# Patient Record
Sex: Male | Born: 1935 | Race: White | Hispanic: No | Marital: Married
Health system: Southern US, Community
[De-identification: ages and names within clinical notes are randomized; demographics above are authoritative.]

---

## 2004-06-22 ENCOUNTER — Ambulatory Visit: Payer: Self-pay | Admitting: Urology

## 2004-07-02 ENCOUNTER — Ambulatory Visit: Payer: Self-pay | Admitting: Radiation Oncology

## 2004-07-10 ENCOUNTER — Ambulatory Visit: Payer: Self-pay | Admitting: Radiation Oncology

## 2004-08-09 ENCOUNTER — Ambulatory Visit: Payer: Self-pay | Admitting: Radiation Oncology

## 2004-09-09 ENCOUNTER — Ambulatory Visit: Payer: Self-pay | Admitting: Radiation Oncology

## 2004-10-10 ENCOUNTER — Ambulatory Visit: Payer: Self-pay | Admitting: Radiation Oncology

## 2005-01-30 ENCOUNTER — Ambulatory Visit: Payer: Self-pay | Admitting: Radiation Oncology

## 2005-02-07 ENCOUNTER — Ambulatory Visit: Payer: Self-pay | Admitting: Radiation Oncology

## 2005-07-08 ENCOUNTER — Ambulatory Visit: Payer: Self-pay | Admitting: Radiation Oncology

## 2005-08-19 ENCOUNTER — Ambulatory Visit: Payer: Self-pay | Admitting: Internal Medicine

## 2005-10-21 ENCOUNTER — Ambulatory Visit: Payer: Self-pay | Admitting: Unknown Physician Specialty

## 2005-12-26 ENCOUNTER — Ambulatory Visit: Payer: Self-pay | Admitting: Unknown Physician Specialty

## 2006-07-03 ENCOUNTER — Ambulatory Visit: Payer: Self-pay | Admitting: Radiation Oncology

## 2006-07-10 ENCOUNTER — Ambulatory Visit: Payer: Self-pay | Admitting: Radiation Oncology

## 2007-07-11 ENCOUNTER — Ambulatory Visit: Payer: Self-pay | Admitting: Radiation Oncology

## 2007-07-20 ENCOUNTER — Ambulatory Visit: Payer: Self-pay | Admitting: Radiation Oncology

## 2007-08-10 ENCOUNTER — Ambulatory Visit: Payer: Self-pay | Admitting: Radiation Oncology

## 2008-07-10 ENCOUNTER — Ambulatory Visit: Payer: Self-pay | Admitting: Radiation Oncology

## 2008-07-21 ENCOUNTER — Ambulatory Visit: Payer: Self-pay | Admitting: Radiation Oncology

## 2008-08-09 ENCOUNTER — Ambulatory Visit: Payer: Self-pay | Admitting: Radiation Oncology

## 2009-07-10 ENCOUNTER — Ambulatory Visit: Payer: Self-pay | Admitting: Radiation Oncology

## 2009-07-20 ENCOUNTER — Ambulatory Visit: Payer: Self-pay | Admitting: Radiation Oncology

## 2009-08-09 ENCOUNTER — Ambulatory Visit: Payer: Self-pay | Admitting: Radiation Oncology

## 2010-07-20 ENCOUNTER — Ambulatory Visit: Payer: Self-pay | Admitting: Radiation Oncology

## 2010-07-21 LAB — PSA

## 2010-08-09 ENCOUNTER — Ambulatory Visit: Payer: Self-pay | Admitting: Radiation Oncology

## 2011-08-26 ENCOUNTER — Ambulatory Visit: Payer: Self-pay | Admitting: Radiation Oncology

## 2011-08-27 LAB — PSA: PSA: 0.7 ng/mL (ref 0.0–4.0)

## 2011-09-10 ENCOUNTER — Ambulatory Visit: Payer: Self-pay | Admitting: Radiation Oncology

## 2012-05-05 ENCOUNTER — Emergency Department: Payer: Self-pay

## 2012-07-22 ENCOUNTER — Ambulatory Visit: Payer: Self-pay | Admitting: Urology

## 2012-07-27 ENCOUNTER — Ambulatory Visit: Payer: Self-pay | Admitting: Urology

## 2013-04-20 ENCOUNTER — Ambulatory Visit: Payer: Self-pay | Admitting: Ophthalmology

## 2013-04-25 ENCOUNTER — Observation Stay: Payer: Self-pay | Admitting: Internal Medicine

## 2013-04-25 LAB — COMPREHENSIVE METABOLIC PANEL
Anion Gap: 6 — ABNORMAL LOW (ref 7–16)
Bilirubin,Total: 1 mg/dL (ref 0.2–1.0)
Chloride: 109 mmol/L — ABNORMAL HIGH (ref 98–107)
Co2: 27 mmol/L (ref 21–32)
EGFR (African American): >60
EGFR (Non-African Amer.): >60
Osmolality: 291 (ref 275–301)
SGPT (ALT): 20 U/L (ref 12–78)
Total Protein: 7.1 g/dL (ref 6.4–8.2)

## 2013-04-25 LAB — CBC
HGB: 13.4 g/dL (ref 13.0–18.0)
MCH: 34.6 pg — ABNORMAL HIGH (ref 26.0–34.0)
MCHC: 34 g/dL (ref 32.0–36.0)
Platelet: 157 10*3/uL (ref 150–440)
RBC: 3.88 10*6/uL — ABNORMAL LOW (ref 4.40–5.90)
WBC: 14.6 10*3/uL — ABNORMAL HIGH (ref 3.8–10.6)

## 2013-04-25 LAB — TROPONIN I: Troponin-I: <0.02 ng/mL

## 2013-04-25 LAB — URINALYSIS, COMPLETE
Bacteria: NONE SEEN
Nitrite: NEGATIVE
Ph: 6 (ref 4.5–8.0)
RBC,UR: 14 /HPF (ref 0–5)

## 2013-04-26 LAB — CBC WITH DIFFERENTIAL/PLATELET
Basophil #: 0 10*3/uL (ref 0.0–0.1)
Basophil %: 0.2 %
Eosinophil #: 0.1 10*3/uL (ref 0.0–0.7)
HCT: 35.5 % — ABNORMAL LOW (ref 40.0–52.0)
Lymphocyte #: 1.6 10*3/uL (ref 1.0–3.6)
Lymphocyte %: 16.4 %
MCH: 35.3 pg — ABNORMAL HIGH (ref 26.0–34.0)
MCHC: 34.6 g/dL (ref 32.0–36.0)
Monocyte #: 1.1 x10 3/mm — ABNORMAL HIGH (ref 0.2–1.0)
Neutrophil %: 70.7 %
Platelet: 136 10*3/uL — ABNORMAL LOW (ref 150–440)
WBC: 9.6 10*3/uL (ref 3.8–10.6)

## 2013-04-26 LAB — BASIC METABOLIC PANEL
Calcium, Total: 8.5 mg/dL (ref 8.5–10.1)
EGFR (African American): >60
EGFR (Non-African Amer.): >60
Osmolality: 289 (ref 275–301)
Sodium: 143 mmol/L (ref 136–145)

## 2013-04-27 LAB — BASIC METABOLIC PANEL
Anion Gap: 2 — ABNORMAL LOW (ref 7–16)
Chloride: 109 mmol/L — ABNORMAL HIGH (ref 98–107)
Co2: 29 mmol/L (ref 21–32)
Creatinine: 0.69 mg/dL (ref 0.60–1.30)
EGFR (African American): >60
EGFR (Non-African Amer.): >60
Glucose: 80 mg/dL (ref 65–99)
Sodium: 140 mmol/L (ref 136–145)

## 2013-08-21 ENCOUNTER — Emergency Department: Payer: Self-pay | Admitting: Emergency Medicine

## 2013-08-21 LAB — COMPREHENSIVE METABOLIC PANEL
Albumin: 3.7 g/dL (ref 3.4–5.0)
Anion Gap: 3 — ABNORMAL LOW (ref 7–16)
BUN: 16 mg/dL (ref 7–18)
Bilirubin,Total: 0.7 mg/dL (ref 0.2–1.0)
Calcium, Total: 9 mg/dL (ref 8.5–10.1)
Chloride: 104 mmol/L (ref 98–107)
Co2: 32 mmol/L (ref 21–32)
EGFR (African American): >60
EGFR (Non-African Amer.): >60
Glucose: 91 mg/dL (ref 65–99)
Osmolality: 278 (ref 275–301)
Potassium: 4.2 mmol/L (ref 3.5–5.1)
SGOT(AST): 27 U/L (ref 15–37)
SGPT (ALT): 22 U/L (ref 12–78)
Sodium: 139 mmol/L (ref 136–145)
Total Protein: 7 g/dL (ref 6.4–8.2)

## 2013-08-21 LAB — CBC WITH DIFFERENTIAL/PLATELET
Basophil #: 0 10*3/uL (ref 0.0–0.1)
Eosinophil #: 0.1 10*3/uL (ref 0.0–0.7)
HGB: 13.8 g/dL (ref 13.0–18.0)
Lymphocyte %: 16.4 %
MCH: 34.5 pg — ABNORMAL HIGH (ref 26.0–34.0)
RBC: 4.02 10*6/uL — ABNORMAL LOW (ref 4.40–5.90)
RDW: 14.4 % (ref 11.5–14.5)
WBC: 9 10*3/uL (ref 3.8–10.6)

## 2013-08-21 LAB — PRO B NATRIURETIC PEPTIDE: B-Type Natriuretic Peptide: 92 pg/mL (ref 0–450)

## 2013-11-27 IMAGING — CT CT HEAD WITHOUT CONTRAST
3 of 4 series · 16 of 30 positions shown, 18 images · non-contrast
Comparison: none

REASON FOR EXAM: weakness
COMMENTS:

[Series 2: without · axial · non-contrast · 0.48mm/px · z∈[-189,-89]mm · 5 of 32 slices shown]
[im 6/32  brain]
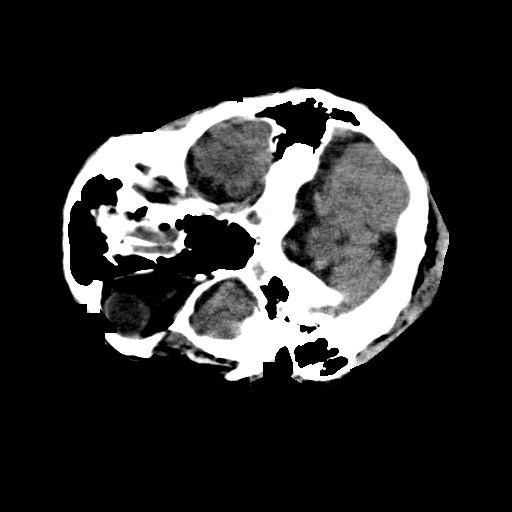
[im 11/32  brain]
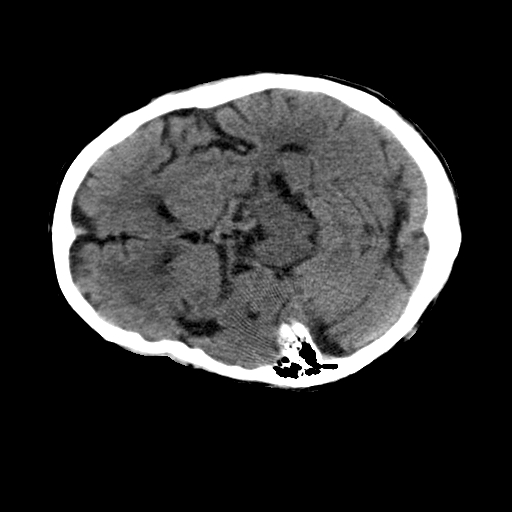
[im 16/32  brain]
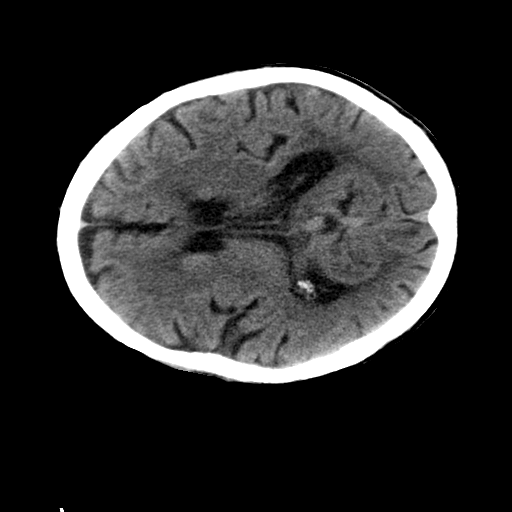
[im 21/32  brain]
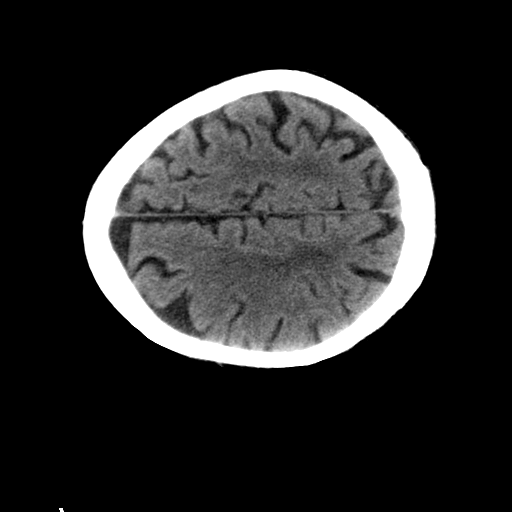
[im 26/32  brain]
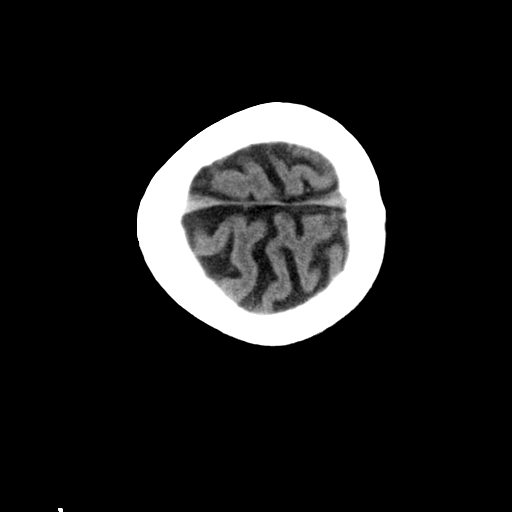

[Series 4: without recon · axial · non-contrast · 0.45mm/px · z∈[-166,-68]mm · 5 of 30 slices shown, 7 images]
[im 5/30  brain]
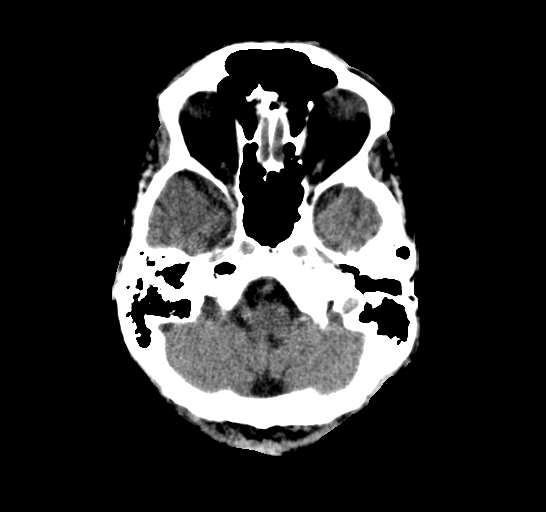
[im 5/30  bone]
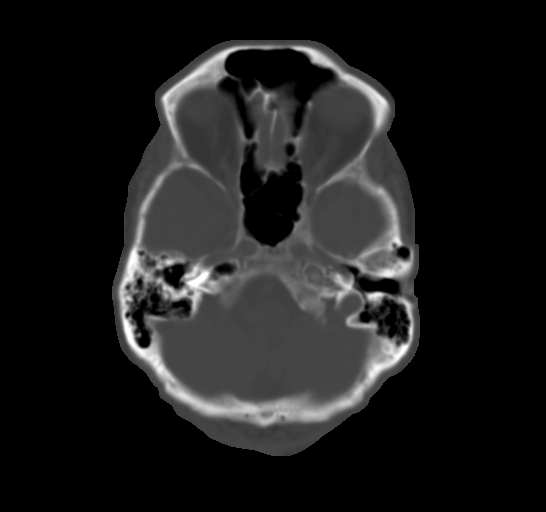
[im 10/30  brain]
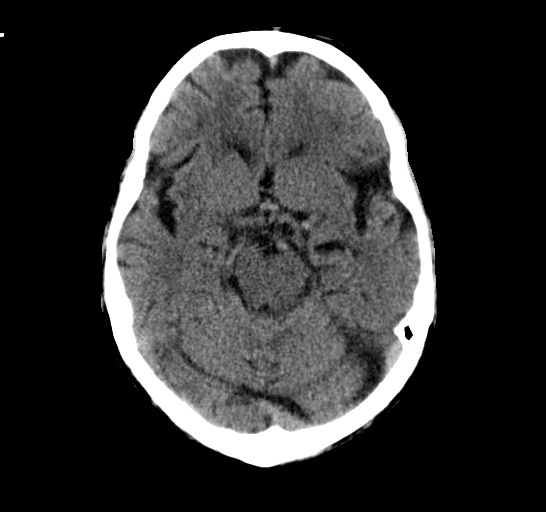
[im 15/30  brain]
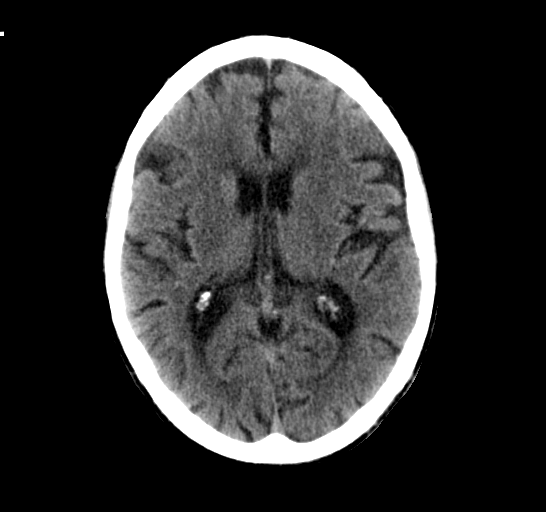
[im 20/30  brain]
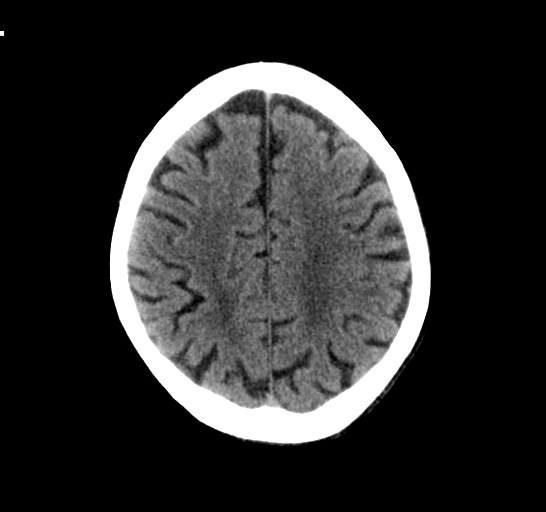
[im 25/30  brain]
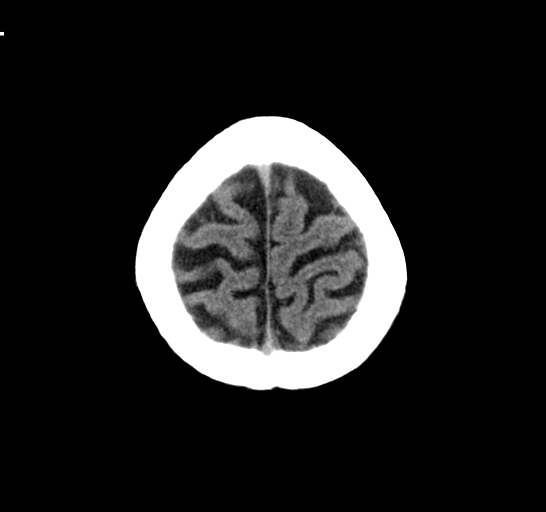
[im 25/30  bone]
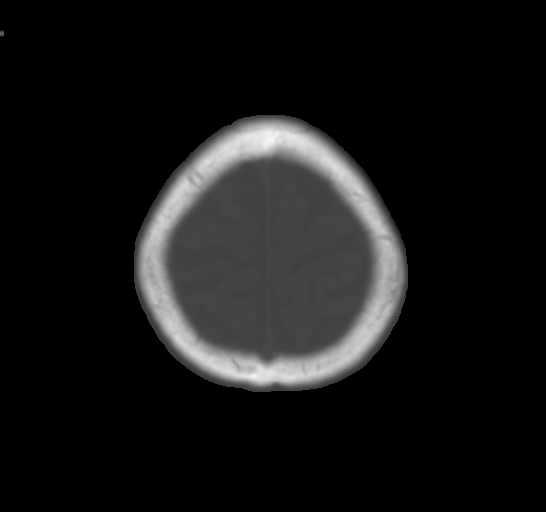

[Series 5: bone recon · axial · 0.37mm/px · z∈[-174,-61]mm · 6 of 33 slices shown]
[im 5/33  bone]
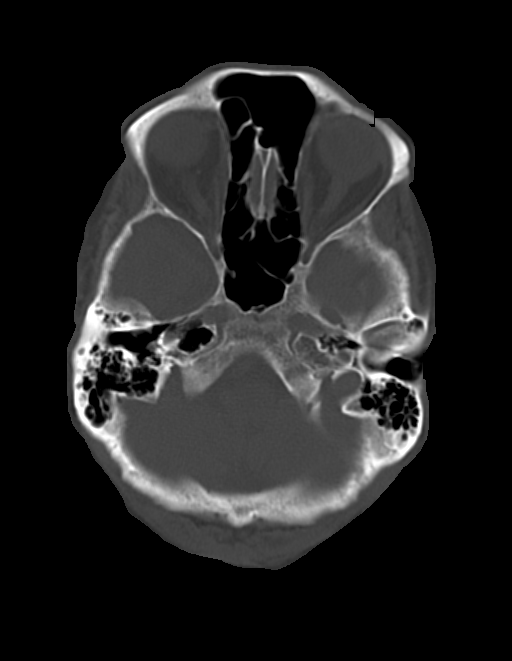
[im 10/33  bone]
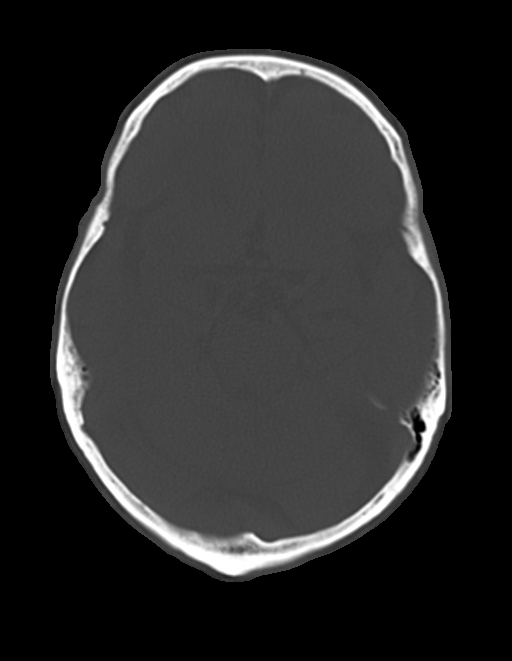
[im 14/33  bone]
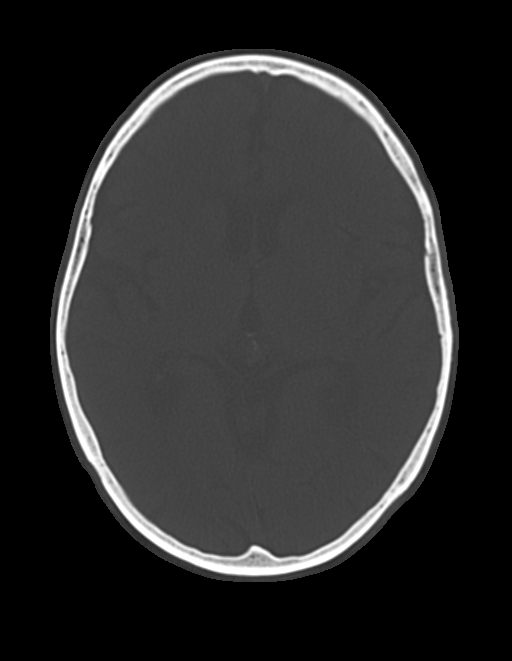
[im 19/33  bone]
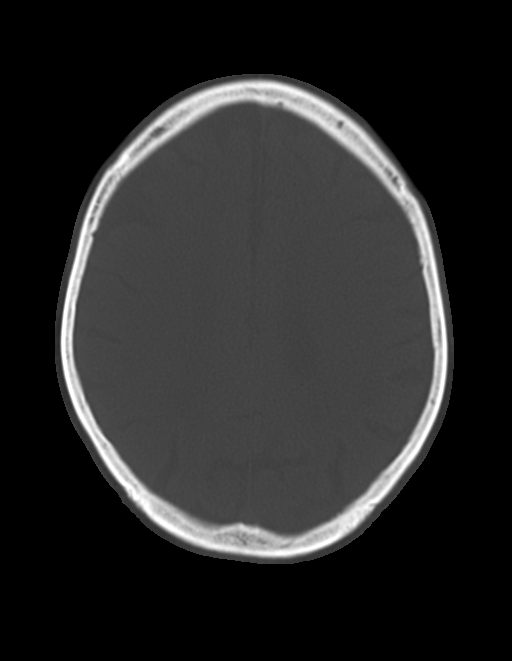
[im 23/33  bone]
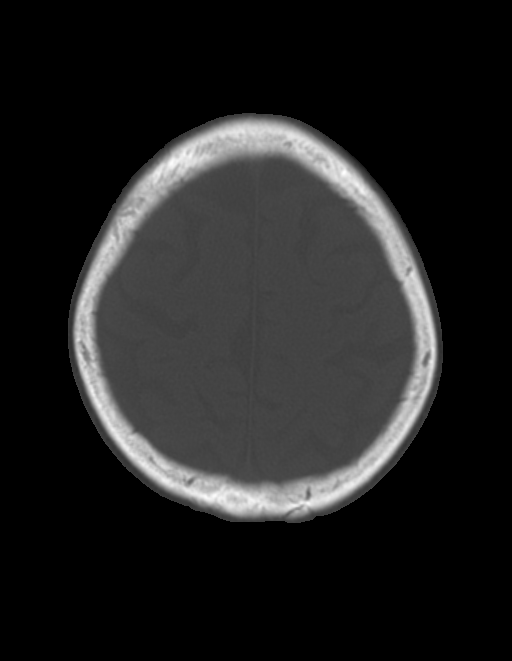
[im 28/33  bone]
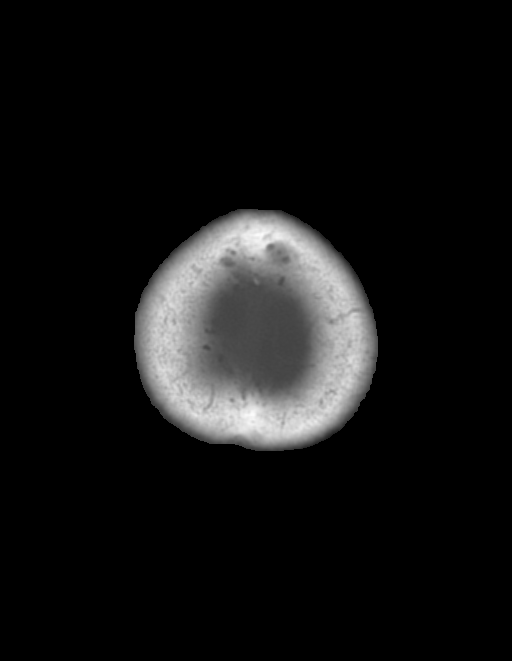

[16 of 30 positions shown; findings below may reference images not displayed]

PROCEDURE:     CT  - CT HEAD WITHOUT CONTRAST  - April 25, 2013  [DATE]

RESULT:     Emergent noncontrast CT of the brain is performed in the
standard fashion. There is no previous exam for comparison.

There is prominence of the ventricles and sulci. Low-attenuation is seen
diffusely in the periventricular and subcortical white matter. There is no
intracranial hemorrhage, mass, mass effect or midline shift. No territorial
infarct is evident. The sinuses and mastoid air cells show normal aeration.
No depressed skull fracture is evident.
IMPRESSION: 1. Changes of atrophy with chronic microvascular ischemic disease. No acute
intracranial abnormality evident.

[REDACTED]

## 2014-03-25 IMAGING — CR DG CHEST 2V
1 series · 2 of 2 positions shown · non-contrast
Comparison: None.

CLINICAL DATA: Short of breath

EXAM:
CHEST  2 VIEW

[Series 3: x chest ap · 0.14mm/px · 2 of 2 slices shown]
[im 1/2]
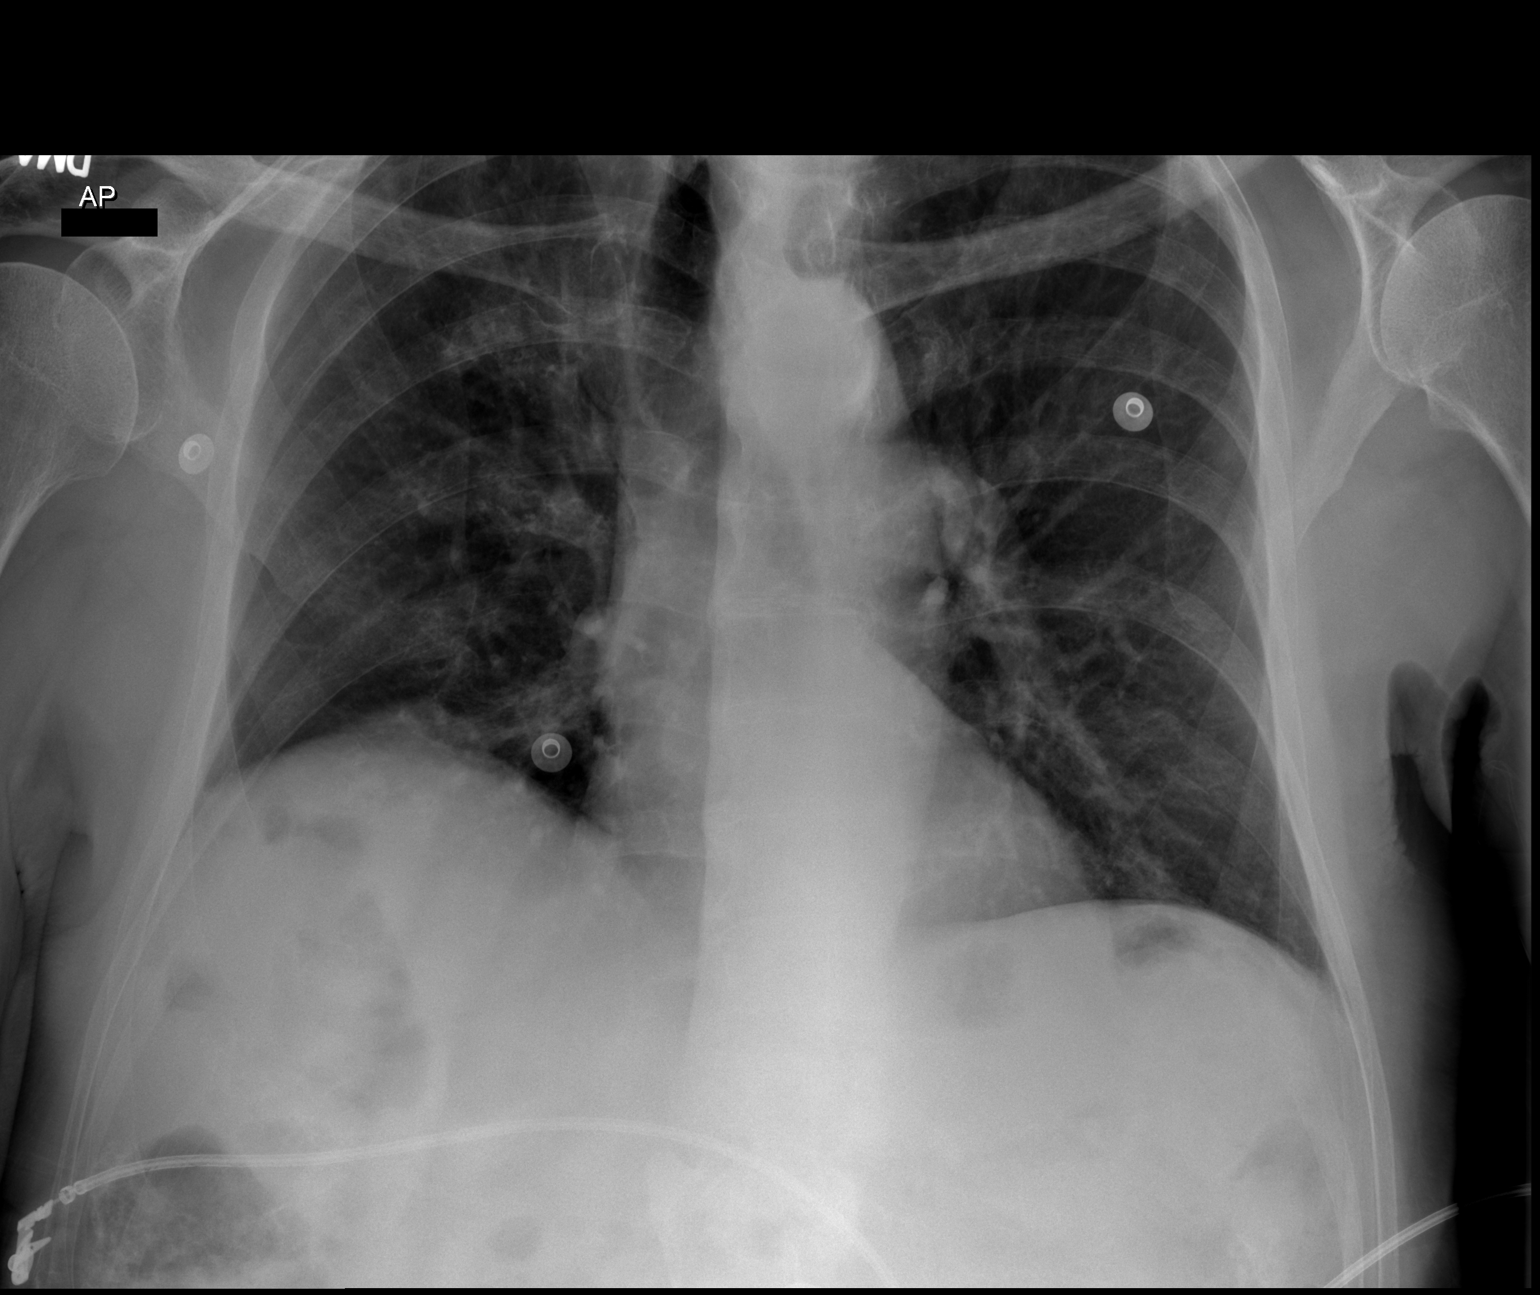
[im 2/2]
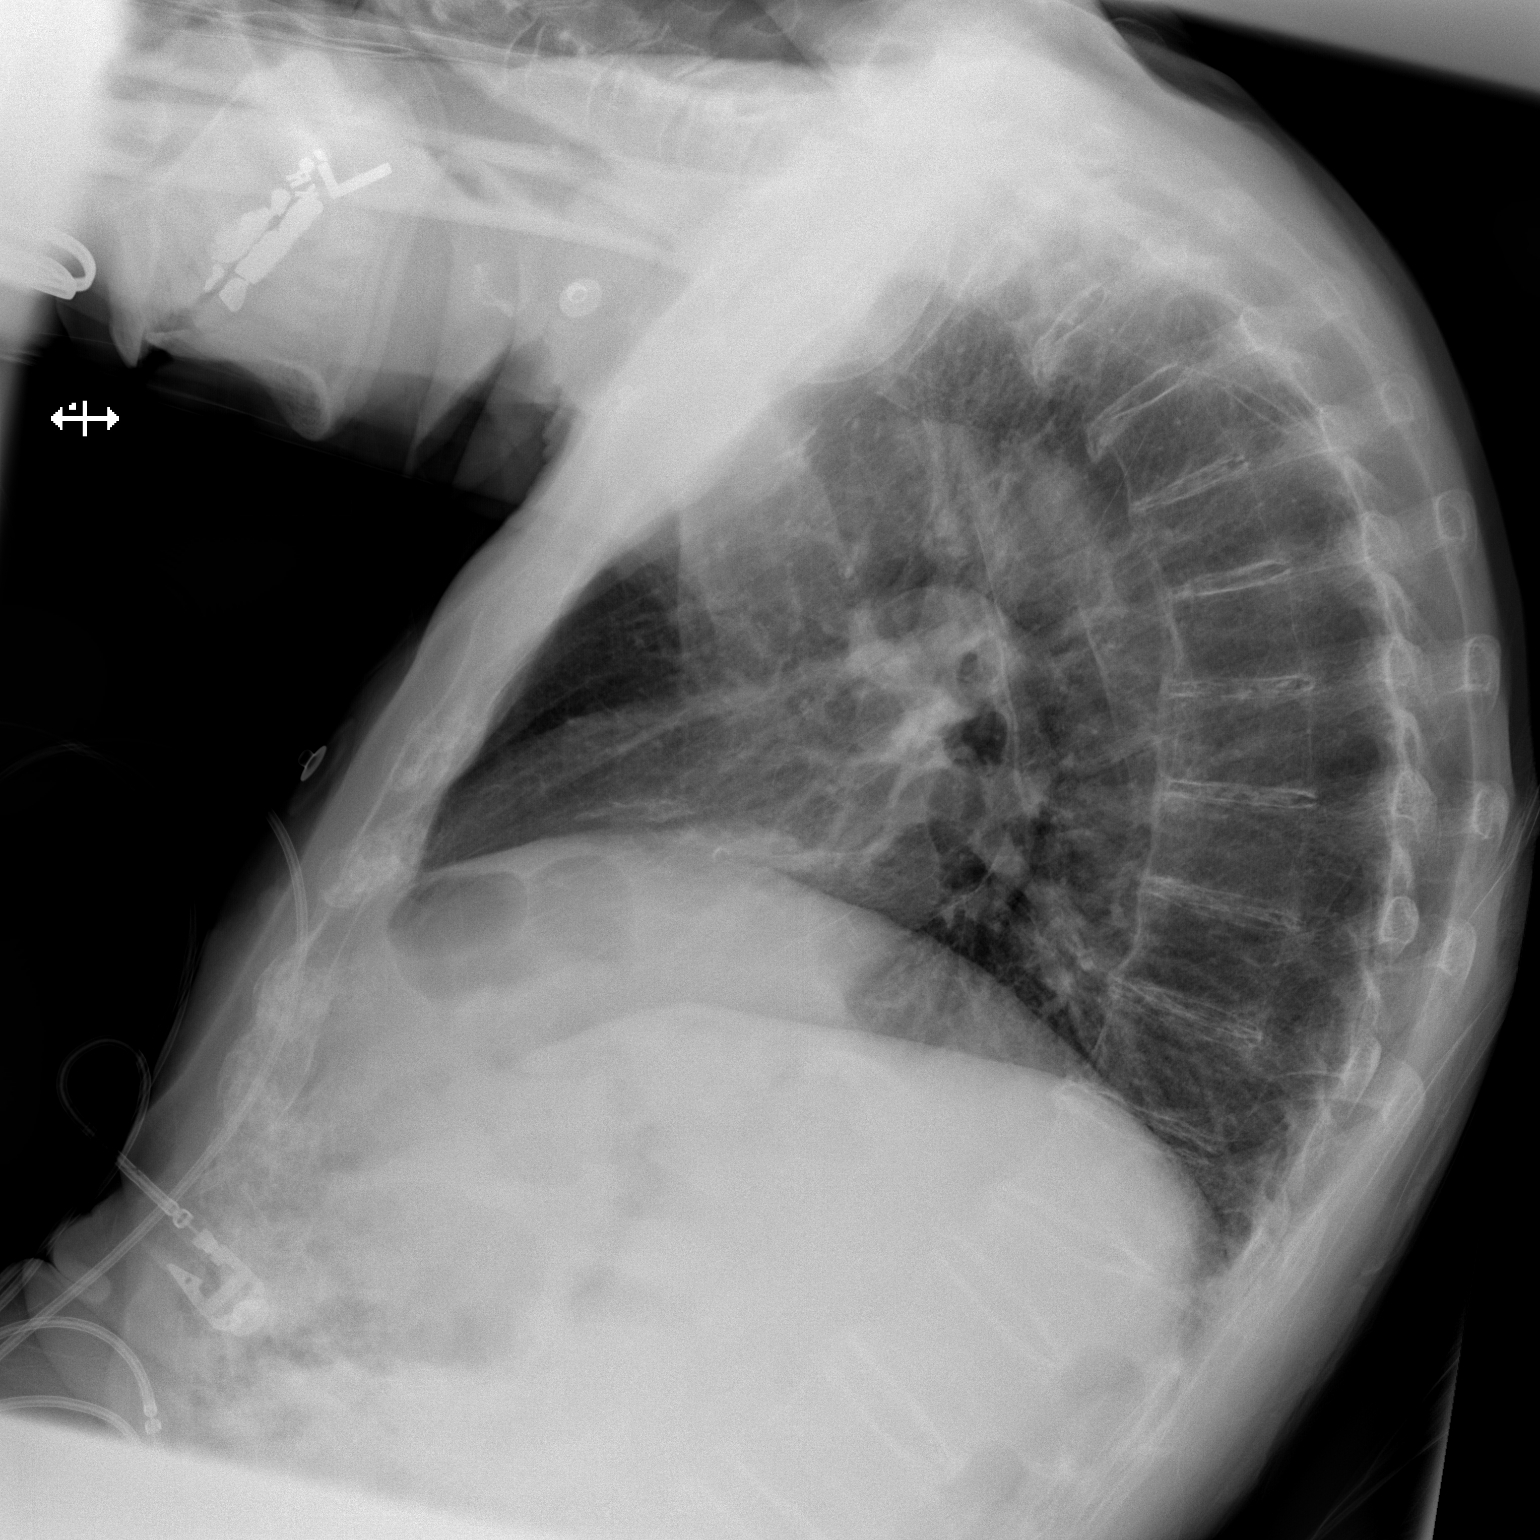

[2 of 2 positions shown; findings below may reference images not displayed]

FINDINGS: Cardiac silhouette is normal in size. Mediastinum and hilar contours
are unremarkable.

There is mild opacity in the right middle lobe that may reflect a
small infiltrate or be due to atelectasis. The latter is favored.
The lungs are otherwise clear. No pleural effusion or pneumothorax.

There is a mild to moderate compression fracture of what is either
T12 or L1, most likely chronic. The bony thorax is demineralized.
IMPRESSION: Small area of opacity in the right middle lobe which may reflect
atelectasis or infiltrate. No other evidence of acute
cardiopulmonary disease.

Mild to moderate compression fracture of what is either T12 or L1,
of unclear chronicity but likely old.

## 2014-10-10 ENCOUNTER — Ambulatory Visit: Payer: Self-pay | Admitting: Internal Medicine

## 2014-10-11 ENCOUNTER — Observation Stay: Payer: Self-pay | Admitting: Internal Medicine

## 2014-10-11 LAB — COMPREHENSIVE METABOLIC PANEL
Albumin: 4 g/dL (ref 3.4–5.0)
Alkaline Phosphatase: 64 U/L (ref 46–116)
Anion Gap: 6 — ABNORMAL LOW (ref 7–16)
BILIRUBIN TOTAL: 0.6 mg/dL (ref 0.2–1.0)
BUN: 30 mg/dL — AB (ref 7–18)
Calcium, Total: 9.2 mg/dL (ref 8.5–10.1)
Chloride: 104 mmol/L (ref 98–107)
Co2: 32 mmol/L (ref 21–32)
Creatinine: 0.69 mg/dL (ref 0.60–1.30)
EGFR (African American): >60
EGFR (Non-African Amer.): >60
GLUCOSE: 75 mg/dL (ref 65–99)
Osmolality: 288 (ref 275–301)
Potassium: 4.4 mmol/L (ref 3.5–5.1)
SGOT(AST): 35 U/L (ref 15–37)
SGPT (ALT): 21 U/L (ref 14–63)
Sodium: 142 mmol/L (ref 136–145)
TOTAL PROTEIN: 7.8 g/dL (ref 6.4–8.2)

## 2014-10-11 LAB — URINALYSIS, COMPLETE
BLOOD: NEGATIVE
Bacteria: NONE SEEN
Bilirubin,UR: NEGATIVE
GLUCOSE, UR: NEGATIVE mg/dL (ref 0–75)
Ketone: NEGATIVE
Leukocyte Esterase: NEGATIVE
Nitrite: NEGATIVE
Ph: 7 (ref 4.5–8.0)
Protein: 30
SPECIFIC GRAVITY: 1.017 (ref 1.003–1.030)
SQUAMOUS EPITHELIAL: NONE SEEN

## 2014-10-11 LAB — CBC
HCT: 40.3 % (ref 40.0–52.0)
HGB: 13.2 g/dL (ref 13.0–18.0)
MCH: 34 pg (ref 26.0–34.0)
MCHC: 32.7 g/dL (ref 32.0–36.0)
MCV: 104 fL — ABNORMAL HIGH (ref 80–100)
PLATELETS: 178 10*3/uL (ref 150–440)
RBC: 3.87 10*6/uL — ABNORMAL LOW (ref 4.40–5.90)
RDW: 14.8 % — AB (ref 11.5–14.5)
WBC: 8.6 10*3/uL (ref 3.8–10.6)

## 2014-10-11 LAB — CK TOTAL AND CKMB (NOT AT ARMC)
CK, Total: 70 U/L (ref 39–308)
CK-MB: 1 ng/mL (ref 0.5–3.6)

## 2014-10-11 LAB — HEMOGLOBIN
HGB: 11.8 g/dL — ABNORMAL LOW (ref 13.0–18.0)
HGB: 11 g/dL — ABNORMAL LOW (ref 13.0–18.0)

## 2014-10-11 LAB — LIPASE, BLOOD: LIPASE: 124 U/L (ref 73–393)

## 2014-10-11 LAB — TROPONIN I: Troponin-I: <0.02 ng/mL

## 2014-10-11 LAB — PROTIME-INR
INR: 1
PROTHROMBIN TIME: 12.9 s

## 2014-10-12 LAB — CBC WITH DIFFERENTIAL/PLATELET
BASOS ABS: 0 10*3/uL (ref 0.0–0.1)
BASOS PCT: 0.3 %
EOS ABS: 0.2 10*3/uL (ref 0.0–0.7)
EOS PCT: 2.6 %
HCT: 31.8 % — ABNORMAL LOW (ref 40.0–52.0)
HGB: 10.7 g/dL — AB (ref 13.0–18.0)
LYMPHS ABS: 1.6 10*3/uL (ref 1.0–3.6)
Lymphocyte %: 19.2 %
MCH: 35.2 pg — AB (ref 26.0–34.0)
MCHC: 33.6 g/dL (ref 32.0–36.0)
MCV: 105 fL — AB (ref 80–100)
Monocyte #: 0.9 x10 3/mm (ref 0.2–1.0)
Monocyte %: 10.4 %
NEUTROS ABS: 5.5 10*3/uL (ref 1.4–6.5)
Neutrophil %: 67.5 %
Platelet: 140 10*3/uL — ABNORMAL LOW (ref 150–440)
RBC: 3.04 10*6/uL — AB (ref 4.40–5.90)
RDW: 14.2 % (ref 11.5–14.5)
WBC: 8.2 10*3/uL (ref 3.8–10.6)

## 2014-10-12 LAB — BASIC METABOLIC PANEL
Anion Gap: 6 — ABNORMAL LOW (ref 7–16)
BUN: 22 mg/dL — AB (ref 7–18)
Calcium, Total: 8 mg/dL — ABNORMAL LOW (ref 8.5–10.1)
Chloride: 111 mmol/L — ABNORMAL HIGH (ref 98–107)
Co2: 27 mmol/L (ref 21–32)
Creatinine: 0.63 mg/dL (ref 0.60–1.30)
EGFR (African American): >60
GLUCOSE: 73 mg/dL (ref 65–99)
Osmolality: 289 (ref 275–301)
POTASSIUM: 4.1 mmol/L (ref 3.5–5.1)
SODIUM: 144 mmol/L (ref 136–145)

## 2014-10-12 LAB — MAGNESIUM: MAGNESIUM: 1.8 mg/dL

## 2014-10-13 LAB — CBC WITH DIFFERENTIAL/PLATELET
BASOS ABS: 0 10*3/uL (ref 0.0–0.1)
Basophil %: 0.3 %
EOS ABS: 0.3 10*3/uL (ref 0.0–0.7)
Eosinophil %: 3 %
HCT: 35.3 % — AB (ref 40.0–52.0)
HGB: 11.8 g/dL — ABNORMAL LOW (ref 13.0–18.0)
LYMPHS ABS: 1.8 10*3/uL (ref 1.0–3.6)
LYMPHS PCT: 21.1 %
MCH: 35 pg — ABNORMAL HIGH (ref 26.0–34.0)
MCHC: 33.5 g/dL (ref 32.0–36.0)
MCV: 105 fL — AB (ref 80–100)
Monocyte #: 1.1 x10 3/mm — ABNORMAL HIGH (ref 0.2–1.0)
Monocyte %: 12.5 %
Neutrophil #: 5.4 10*3/uL (ref 1.4–6.5)
Neutrophil %: 63.1 %
Platelet: 152 10*3/uL (ref 150–440)
RBC: 3.38 10*6/uL — ABNORMAL LOW (ref 4.40–5.90)
RDW: 14.7 % — AB (ref 11.5–14.5)
WBC: 8.6 10*3/uL (ref 3.8–10.6)

## 2014-10-13 LAB — BASIC METABOLIC PANEL
Anion Gap: 6 — ABNORMAL LOW (ref 7–16)
BUN: 17 mg/dL (ref 7–18)
Calcium, Total: 8.3 mg/dL — ABNORMAL LOW (ref 8.5–10.1)
Chloride: 109 mmol/L — ABNORMAL HIGH (ref 98–107)
Co2: 26 mmol/L (ref 21–32)
Creatinine: 0.7 mg/dL (ref 0.60–1.30)
EGFR (African American): >60
EGFR (Non-African Amer.): >60
GLUCOSE: 71 mg/dL (ref 65–99)
OSMOLALITY: 281 (ref 275–301)
POTASSIUM: 4.1 mmol/L (ref 3.5–5.1)
SODIUM: 141 mmol/L (ref 136–145)

## 2014-10-14 LAB — CBC WITH DIFFERENTIAL/PLATELET
BASOS PCT: 0.2 %
Basophil #: 0 10*3/uL (ref 0.0–0.1)
EOS PCT: 3.5 %
Eosinophil #: 0.3 10*3/uL (ref 0.0–0.7)
HCT: 34.8 % — AB (ref 40.0–52.0)
HGB: 11.6 g/dL — AB (ref 13.0–18.0)
LYMPHS ABS: 1.5 10*3/uL (ref 1.0–3.6)
Lymphocyte %: 17.5 %
MCH: 34.7 pg — ABNORMAL HIGH (ref 26.0–34.0)
MCHC: 33.5 g/dL (ref 32.0–36.0)
MCV: 104 fL — AB (ref 80–100)
Monocyte #: 1 x10 3/mm (ref 0.2–1.0)
Monocyte %: 12 %
Neutrophil #: 5.8 10*3/uL (ref 1.4–6.5)
Neutrophil %: 66.8 %
Platelet: 163 10*3/uL (ref 150–440)
RBC: 3.35 10*6/uL — AB (ref 4.40–5.90)
RDW: 14.8 % — ABNORMAL HIGH (ref 11.5–14.5)
WBC: 8.7 10*3/uL (ref 3.8–10.6)

## 2014-10-21 ENCOUNTER — Observation Stay: Payer: Self-pay

## 2014-11-08 ENCOUNTER — Ambulatory Visit
Admit: 2014-11-08 | Disposition: A | Discharge status: Home / Self Care | Payer: Self-pay | Attending: Internal Medicine | Admitting: Internal Medicine

## 2014-12-27 NOTE — Op Note (Signed)
PATIENT NAME:  Johnathan Jones, Johnathan Jones MR#:  [id number] DATE OF BIRTH:  01/16/1929  DATE OF PROCEDURE:  07/27/2012  PREOPERATIVE DIAGNOSIS: Benign prostatic hypertrophy with bladder outlet obstruction.   POSTOPERATIVE DIAGNOSIS: Benign prostatic hypertrophy with bladder outlet obstruction.   PROCEDURE: Photovaporization of the prostate with green light laser.   SURGEON: Suszanne ConnersMichael R. Evelene CroonWolff, MD   ANESTHETIST: Dr. Maisie Fushomas    ANESTHETIC METHOD: General.   INDICATIONS: See the dictated history and physical. After informed consent, the patient requests the above procedure.   OPERATIVE SUMMARY: After adequate general anesthesia had been obtained, the patient was placed into the dorsal lithotomy position and the perineum was prepped and draped in the usual fashion. Laser scope was coupled with the camera and then visually advanced into the bladder. The bladder was heavily trabeculated with multiple small diverticula present. No bladder tumors were identified. Both ureteral orifices were identified and had clear efflux. The patient had trilobar benign prostatic hypertrophy with visual obstruction. At this point the green light XPS laser fiber was introduced through the scope and vaporization was begun at 80 watts. Bladder neck tissue was fully vaporized. Power was then increased to 120 watts and tissue extending from the bladder neck to the verumontanum was vaporized. Finally, power was increased to 180 watts and remaining obstructive tissue was vaporized. At this point the bladder was drained and the scope was removed. 20 French silicone catheter was placed. Catheter was irrigated until clear. A B and O suppository was placed. The procedure was then terminated and the patient was transferred to the recovery room in stable condition.   ____________________________ Suszanne ConnersMichael R. Evelene CroonWolff, MD mrw:drc D: 07/27/2012 09:14:42 ET T: 07/27/2012 10:22:29 ET JOB#: [id number] cc: Suszanne ConnersMichael R. Evelene CroonWolff, MD, <Dictator>, Marya AmslerMarshall Jones.  Dareen PianoAnderson, MD Orson ApeMICHAEL R WOLFF MD ELECTRONICALLY SIGNED 07/27/2012 12:51

## 2014-12-27 NOTE — H&P (Signed)
PATIENT NAME:  Johnathan Jones, [name] MR#:  [id number] DATE OF BIRTH:  03-Oct-1928  DATE OF ADMISSION:  07/27/2012  CHIEF COMPLAINT: Difficulty voiding.   HISTORY OF PRESENT ILLNESS: Johnathan Jones is an 81 year old white male who presented to the office in August with severe urinary symptoms. AUA symptom score was 28 with a quality of life score of 5. He had a Foley catheter placed at that time and was started on Flomax. The catheter was discontinued, but his urinary symptoms have not improved much. He underwent cystoscopy on 10/04 which indicated trilobar benign prostatic hypertrophy with intravesical growth of median lobe. He had small diverticula of the bladder and the bladder was heavily trabeculated. Uroflow study in September indicated maximum flow rate of 11 mL/sec with an average flow of 2 mL/sec with obstructive uroflow curve. The patient comes in now for photovaporization of the prostate with GreenLight laser.   ALLERGIES: Tylenol and Zithromax.   CURRENT MEDICATIONS: Flomax, Xalatan eyedrops, Micardis, Prevacid, Ecotrin, and a stool softener.   PAST SURGICAL HISTORY: No prior surgical procedures.   SOCIAL HISTORY: The patient denied tobacco or alcohol use.   FAMILY HISTORY: Remarkable for brother with prostate cancer and a brother with diabetes and hypertension.   PAST AND CURRENT MEDICAL CONDITIONS:  1. Prostate cancer status post external beam radiation therapy 2005.  2. Hyperlipidemia.  3. Osteoporosis.  4. Hypertension.  5. Chronic thrombocytopenia.  6. Glaucoma.  7. Hypercholesterolemia.   REVIEW OF SYSTEMS: The patient denied chest pain or shortness of breath. He has numbness and tingling of his feet. He denies diarrhea or bloody stools.   PHYSICAL EXAMINATION:  GENERAL:  Chronically ill, elderly white male in no acute distress.   HEENT: Sclerae were clear. Pupils were equally round and reactive to light and accommodation. Extraocular movements were intact.   NECK: Supple. No  palpable cervical adenopathy.   LUNGS: Clear to auscultation.   HEART: Regular rhythm and rate without audible murmurs.   ABDOMEN: Soft, nontender abdomen.   GENITOURINARY: Uncircumcised. Testes atrophic.   RECTAL: 35-gram, smooth nontender prostate. Otherwise unremarkable.   NEUROMUSCULAR: Nonfocal.   IMPRESSION:  1. Lower urinary tract symptoms. 2. Benign prostatic hypertrophy.  3. History of prostate cancer.   PLAN: Photovaporization of the prostate with GreenLight laser. The patient has been cleared for this procedure per Dr. Yates DecampJohn Walker.   ____________________________ Suszanne ConnersMichael R. Evelene CroonWolff, MD mrw:bjt D: 07/22/2012 13:14:26 ET T: 07/22/2012 13:56:07 ET JOB#: [id number]  cc: Suszanne ConnersMichael R. Evelene CroonWolff, MD, <Dictator> Orson ApeMICHAEL R Laporsche Hoeger MD ELECTRONICALLY SIGNED 07/27/2012 7:47

## 2014-12-30 NOTE — Discharge Summary (Signed)
PATIENT NAME:  Johnathan Jones, Johnathan Jones DATE OF BIRTH:  Mar 20, 1929  DATE OF ADMISSION:  04/25/2013 DATE OF DISCHARGE:  04/28/2013  HISTORY OF PRESENT ILLNESS: Johnathan Jones is an 81 year old gentleman with end-stage Parkinson's disease, who had fallen off the toilet at home. He was unable to get up off the floor. They could not get him to ambulate. He was therefore brought to the Emergency Room for further evaluation. The patient was noted to have evidence of a possible urinary infection and an elevated white count. He was therefore admitted with failure to thrive secondary to urinary tract infection and Parkinson's disease.   ALLERGIES: TIMOLOL AND TYLENOL.   PAST MEDICAL HISTORY: Notable for previous prostate cancer with radiation in 2005, hypertension, hyperlipidemia, osteoporosis, glaucoma and a history of chronic thrombocytopenia.   ADMISSION MEDICATIONS: Were not recorded by the admitting physician.   ADMISSION LABORATORY DATA: Showed a glucose 111, BUN 34, creatinine 0.86, sodium 142, potassium 4.2, chloride 109, CO2 27, calcium 9.3. LFTs were normal. Troponin was normal. Hemoglobin was 13.4 with a white count of 14,600 and platelet count was 157,000. Urinalysis showed 1+ leukocyte esterase. Unenhanced head CT in the ER showed chronic microvascular ischemic changes.   HOSPITAL COURSE: The patient was admitted to the regular floor where he was rehydrated with IV fluids. He was also started on antibiotics for a suspected urinary tract infection. He was seen by physical therapy, who actually recommended rehab for the patient. The patient's family; however, felt that they could handle him better at home. The patient's final chemistry panel prior to discharge was notable only for a chloride of 109 and  a calcium of 8.3. BUN was 17 with a creatinine of 0.69. (Dictation Anomaly) Blood culture on admission showed no growth. Urine culture showed mixed bacterial organisms.   DISCHARGE DIAGNOSES:   1.  Failure to thrive, secondary to Parkinson's disease.  2.  Urinary tract infection. 3.  Dehydration.   DISCHARGE MEDICATIONS:  1.  Latanoprost 0.005% ophthalmic solution 1 drop to each eye at bedtime.  2.  Ilevro 0.3% ophthalmic suspension 1 drop to each eye once a day.  3.  Brimonidine 0.15% ophthalmic solution 1 drop to each eye b.i.d.  4.  Micardis 80 mg daily.  5.  Colace 100 mg b.i.d. p.r.n.  6.  Ciprofloxacin 500 mg every 12 hours for an additional 7 days.   DISCHARGE DISPOSITION: The patient was discharged home on a regular diet with activity as tolerated. He will continue to get home health and physical therapy at home. He was discharged with a walker. He is to be followed up in the office in 4 to 6 weeks.  ____________________________ Johnathan PateJohn B. Danne HarborWalker III, Johnathan Jones [email or web address] D: 05/17/2013 08:28:47 ET T: 05/17/2013 08:59:11 ET JOB#: [id number]  cc: Jonny RuizJohn B. Danne HarborWalker III, Johnathan Jones, <Dictator> Elmo PuttJOHN B WALKER III Johnathan Jones ELECTRONICALLY SIGNED 05/17/2013 10:41

## 2014-12-30 NOTE — H&P (Signed)
PATIENT NAME:  Dyanne IhaROBERTS, Shammond W MR#:  [id number] DATE OF BIRTH:  [birth year]  DATE OF ADMISSION:  04/25/2013  PRIMARY CARE PROVIDER: Dr. Dan HumphreysWalker.   EMERGENCY DEPARTMENT REFERRING PHYSICIAN: Dr. Trenton FoundsJohnson Williams.   CHIEF COMPLAINT: Difficulty with ambulation and generalized weakness.   HISTORY OF PRESENT ILLNESS: The patient is an 81 year old with history of Parkinson's disease, has history of hypertension, who earlier today was trying to strain to go to the bathroom and all of a sudden had a large bowel movement all over the toilet, and he tried to clean it up when all of a sudden he fell on the side. Then, he could not get up so EMS was called. When they arrived, the patient was very weak. He was brought to the ED, and they tried to ambulate him and could not ambulate him. The patient also was noted to have a possible urine infection as well as he also had an elevated WBC count. The patient reports that he has been eating good, drinking good. He does not have any chest pains or shortness of breath. He usually walks without any assistance. He does have mild Parkinson's disease that is newly diagnosed. He walks hunched over but has not had any falls. The patient also is scheduled to have cataract surgery tomorrow at 9:30.   ALLERGIES: TIMOLOL AND TYLENOL.    PAST MEDICAL HISTORY: Significant for:  1. Prostate cancer, status post external bleeding radiation in 2005.  2. Hyperlipidemia.  3. Osteoporosis.  4. Hypertension.  5. History of chronic thrombocytopenia.  6. Glaucoma.  7. Hypercholesterolemia.   MEDICATIONS: The patient does not recall all of his medications but wife states that he is on glaucoma eyedrops. He is also on Micardis.   SOCIAL HISTORY: Denies any tobacco or alcohol use.   FAMILY HISTORY: Brother with prostate cancer as well as diabetes and hypertension.   REVIEW OF SYSTEMS:   CONSTITUTIONAL: Complains of generalized weakness. Reports about 20-pound weight loss over the past  year.  EYES: He has cataract that is scheduled for surgery tomorrow. Denies any eye pain. No double vision.  ENT: Denies any tinnitus. Has no difficulty with hearing. No nasal congestion. No seasonal allergies. No difficulty swallowing.  CARDIOVASCULAR: Denies any chest pains, palpitations or syncope. No orthopnea.  PULMONARY: Denies any cough, wheezing or hemoptysis. No COPD.   GASTROINTESTINAL: Denies any nausea, vomiting or diarrhea.  GENITOURINARY: Complains of burning with urination but that is a chronic problem according to him. Has a history of prostate cancer.  HEMATOLOGIC: Denies any easy bruisability or bleeding.  SKIN: Denies any rash.  LYMPHATIC: Denies any lymph node enlargement.  VASCULAR: Denies any claudication symptoms.  NEUROLOGIC: No numbness, CVA. No TIA. Has Parkinson's diagnosis.  PSYCHIATRIC: Not anxious or depressed.   LABORATORY DATA: Glucose 111, BUN 34, creatinine 0.86, sodium 142, potassium 4.2, chloride 109, CO2 27, calcium 9.3. LFTs are normal. Troponin less than 0.02. WBC 14.6, hemoglobin 13.4, platelet count is 157. URINALYSIS: Leukocytes 1+, WBCs 13. EKG shows sinus rhythm with PACs. CT scan of the head atrial fib with chronic microvascular ischemic changes.   ASSESSMENT AND PLAN: The patient is an 81 year old with history of hypertension, Parkinson's, glaucoma, is being admitted for generalized weakness, difficulty with ambulation.  1. Ambulatory dysfunction at this time could be related to some dehydration and possible urinary tract infection. Will treat him with intravenous antibiotics and intravenous fluids. Will get physical therapy evaluation and ambulate.  2. Weakness, possibly due to dehydration and possible urinary  tract infection. Will treat with antibiotics and intravenous fluids.  3. Leukocytosis, possibly due to urinary tract infection. Will treat him with intravenous ceftriaxone.  4. Hypertension. Will continue Micardis.  5. Miscellaneous. Will  place him on Lovenox for deep vein thrombosis prophylaxis.   TIME SPENT: Note 45 minutes spent on this patient.    ____________________________ Lacie Scotts. Allena Katz, MD shp:gb D: 04/25/2013 19:38:02 ET T: 04/25/2013 21:51:31 ET JOB#: [id number]  cc: Aeryn Medici H. Allena Katz, MD, <Dictator> Charise Carwin MD ELECTRONICALLY SIGNED 04/26/2013 16:31

## 2015-01-08 NOTE — Consult Note (Signed)
Chief Complaint:  Subjective/Chief Complaint I discussed the case with patients daughter/POA Ms Bess KindsKimrey this evening, explaining my concerns about EGD due to positioning.  Patient has been stable, without evidence of ongoing bleeding.  Some drift of HGB.  Recommend continuing iv ppi drip for now, change to iv bid tomorrow,l continue po bid as o/p for a month then daily.  Also recommended daily use of miralax, reduced to regular every other day use if too much, but using regularly, not as a prn. Recommend continuing observation for another 48 hours.   Dr Mechele CollinElliott covering tomorrow.   VITAL SIGNS/ANCILLARY NOTES: **Vital Signs.:   03-Feb-16 16:46  Vital Signs Type Q 8hr  Temperature Temperature (F) 97.7  Celsius 36.5  Temperature Source oral  Pulse Pulse 79  Respirations Respirations 18  Systolic BP Systolic BP 156  Diastolic BP (mmHg) Diastolic BP (mmHg) 74  Mean BP 101  Pulse Ox % Pulse Ox % 99  Pulse Ox Activity Level  At rest  Oxygen Delivery Room Air/ 21 %   Electronic Signatures: Barnetta ChapelSkulskie, Fong Mccarry (MD)  (Signed 03-Feb-16 20:05)  Authored: Chief Complaint, VITAL SIGNS/ANCILLARY NOTES   Last Updated: 03-Feb-16 20:05 by Barnetta ChapelSkulskie, Anish Vana (MD)

## 2015-01-08 NOTE — H&P (Signed)
PATIENT NAME:  Johnathan Jones, Johnathan Jones MR#:  [id number] DATE OF BIRTH:  [birth year]  DATE OF ADMISSION:  10/21/2014  REFERRING PHYSICIAN:  Lathrop Sink. Dolores Frame, MD   PRIMARY CARE PHYSICIAN:  Alani B. Danne Harbor, MD   ADMISSION DIAGNOSIS:  Gastrointestinal bleed.   HISTORY OF PRESENT ILLNESS:  This is an 81 year old Caucasian male who presents to the Emergency Department complaining of 2 episodes of nonpainful, bright red blood per rectum. The patient denies any associated nausea or vomiting, but he admits to increased generalized weakness as well as decreased appetite over the last 2 to 3 days. He has been eating a BRAT diet due to similar symptoms that he had last week. At that time, the patient's bloody stool was more melenic. Today, his caretakers took a photograph of the blood specimen in a diaper, which was clearly brighter red blood. Due to his recurrent symptoms of gastrointestinal bleed, the Emergency Department staff called for admission.   REVIEW OF SYSTEMS: CONSTITUTIONAL:  The patient denies fever, but admits to generalized weakness.  EYES:  He denies inflammation, but admits to blurred vision, which is a chronic problem.  EARS, NOSE, AND THROAT:  Denies tinnitus or sore throat.  RESPIRATORY:  Denies cough or shortness of breath.  CARDIOVASCULAR:  Denies chest pain, palpitations, orthopnea, or paroxysmal nocturnal dyspnea.  GASTROINTESTINAL:  Denies nausea, vomiting, diarrhea, or abdominal pain.  GENITOURINARY:  Denies dysuria, increased frequency, or hesitancy of urination.  ENDOCRINE:  Denies polyuria or polydipsia.  HEMATOLOGIC AND LYMPHATIC:  Denies easy bruising, but admits to bleeding as described above.  INTEGUMENTARY:  Denies rashes or lesions.  MUSCULOSKELETAL:  Denies myalgias or arthralgias.  NEUROLOGIC:  Denies numbness or tingling in his extremities and he denies dysarthria.  PSYCHIATRIC:  Denies depression or suicidal ideation.   PAST MEDICAL HISTORY:  Glaucoma, history of prostate  cancer status post radiation, and Parkinson disease, as well as hypertension.   PAST SURGICAL HISTORY:  Tonsillectomy and adenoidectomy.   SOCIAL HISTORY:  The patient lives with his wife, who is bedridden. They both require 24-hour care. He denies smoking, drinking, or any illicit drug use.   FAMILY HISTORY:  His brother has also had prostate cancer, and he has another brother who has diabetes.   MEDICATIONS: 1.  Brimonidine ophthalmic drops 1 drop to each affected eye twice a day.  2.  Docusate sodium 100 mg 1 capsule p.o. b.i.d. as needed for constipation.  3.  Ilevro 0.3% ophthalmic suspension 1 drop to each affected eye daily.  4.  Latanoprost 0.005% ophthalmic solution 1 drop to each affected eye at bedtime.  5.  Micardis 80 mg 1 tablet p.o. daily.  6.  Pantoprazole 40 mg delayed-release tablet 1 tab p.o. daily.   ALLERGIES:  TYLENOL AND TIMOLOL.   PERTINENT LABORATORY RESULTS AND RADIOGRAPHIC FINDINGS:  Serum glucose is 76, BUN 23, creatinine 0.75, serum sodium 140, potassium 3.8, chloride 108, bicarbonate 26, calcium 8.9, lipase 134, albumin 3.2, alkaline phosphatase 55, AST 25, and ALT is 18. Troponin is negative. White blood cell count is 8.8, hemoglobin 12, hematocrit 36.1, platelet count 202,000, and MCV is 105. INR is 1. Chest x-ray shows no acute cardiopulmonary process, though the film was taken with low inspiration.    PHYSICAL EXAMINATION: VITAL SIGNS:  Temperature is 98.6, pulse 69, respirations 18, blood pressure 154/73, pulse oximetry is 98% on room air.  GENERAL:  The patient is alert and oriented x 3 and in no apparent distress.  HEENT:  Normocephalic, atraumatic.  Pupils are equal, round, and reactive to light and accommodation. Extraocular movements are intact. Mucous membranes are moist. The patient is drooling because he has a hard time handling his secretions.  NECK:  Trachea is midline. No adenopathy. Thyroid is nonpalpable and nontender. The patient has a  significant kyphosis that permanently keeps his head tilted downward.  CHEST:  Symmetric and atraumatic.  CARDIOVASCULAR:  Regular rate and rhythm. Normal S1 and S2. No rubs, clicks, or murmurs appreciated.  LUNGS:  Clear to auscultation bilaterally. Normal effort and excursion.  ABDOMEN:  Positive bowel sounds. Soft and nontender, but mildly distended. There is no hepatosplenomegaly.  GENITOURINARY:  The patient is wearing a diaper.  MUSCULOSKELETAL:  The patient moves all 4 extremities equally, but very little as it is difficult for him to move due to Parkinson's. I have not tested his gait.  SKIN:  Warm and dry. No rashes or lesions.  EXTREMITIES:  No clubbing, cyanosis, or edema.  NEUROLOGIC:  Cranial nerves II through XII are grossly intact, although there is significant psychomotor slowing. The patient admits to a tremor, which I have not observed in the Emergency Department.  PSYCHIATRIC:  Mood is normal. Affect is somewhat flat, and the patient has stone facies. He has excellent insight and judgment into his medical condition.   ASSESSMENT AND PLAN:  This is an 81 year old male admitted for gastrointestinal bleed.   1.  Gastrointestinal bleed. It was thought to be an upper gastrointestinal bleed last time, but the bright red bleeding per rectum on this occasion suggests that the bleed may be lower. The patient had been scheduled for an outpatient upper endoscopy prior to this hospital visit. However, his kyphosis was too great to safely place the endoscope. Thus, we do not know if he has an upper gastrointestinal bleed in addition to what appears to be a lower gastrointestinal bleed. Nonetheless, he is hemodynamically stable. We will obtain serial hematocrits, and I have consulted Gastroenterology for possible colonoscopy or potential tagged red blood cell scan to identify potential areas of bleeding.  2.  Glaucoma. We will continue eyedrops per the patient's home regimen.  3.  Parkinson  disease. This is stable at this time. It is unclear why the patient is not on any dopaminergic medications.  4.  Deep vein thrombosis prophylaxis with sequential compression devices.  5.  Gastrointestinal prophylaxis with Protonix.   CODE STATUS:  The patient is a full code.   TIME SPENT ON ADMISSION ORDERS AND PATIENT CARE:  Approximately 35 minutes.    ____________________________ Kelton PillarMichael S. Sheryle Hailiamond, MD msd:nb D: 10/21/2014 05:13:46 ET T: 10/21/2014 06:46:52 ET JOB#: [id number]  cc: Kelton PillarMichael S. Sheryle Hailiamond, MD, <Dictator> Kelton PillarMICHAEL S Zeva Leber MD ELECTRONICALLY SIGNED 10/21/2014 7:42

## 2015-01-08 NOTE — Consult Note (Signed)
Chief Complaint:  Subjective/Chief Complaint seeen for gi bleeding.  denies nauaea or abdominal pain.  no bm overnight.   VITAL SIGNS/ANCILLARY NOTES: **Vital Signs.:   03-Feb-16 08:43  Vital Signs Type Q 8hr  Temperature Temperature (F) 98  Celsius 36.6  Temperature Source oral  Pulse Pulse 75  Respirations Respirations 17  Systolic BP Systolic BP 300  Diastolic BP (mmHg) Diastolic BP (mmHg) 73  Mean BP 96  Pulse Ox % Pulse Ox % 98  Pulse Ox Activity Level  At rest  Oxygen Delivery Room Air/ 21 %   Brief Assessment:  Cardiac Regular   Respiratory clear BS   Gastrointestinal details normal Soft  Nontender  Nondistended  Bowel sounds normal   Lab Results: Routine Chem:  03-Feb-16 04:06   Glucose, Serum 73  BUN  22  Creatinine (comp) 0.63  Sodium, Serum 144  Potassium, Serum 4.1  Chloride, Serum  111  CO2, Serum 27  Calcium (Total), Serum  8.0  Anion Gap  6  Osmolality (calc) 289  eGFR (African American) >60  eGFR (Non-African American) >60 (eGFR values <32m/min/1.73 m2 may be an indication of chronic kidney disease (CKD). Calculated eGFR, using the MRDR Study equation, is useful in  patients with stable renal function. The eGFR calculation will not be reliable in acutely ill patients when serum creatinine is changing rapidly. It is not useful in patients on dialysis. The eGFR calculation may not be applicable to patients at the low and high extremes of body sizes, pregnant women, and vegetarians.)  Magnesium, Serum 1.8 (1.8-2.4 THERAPEUTIC RANGE: 4-7 mg/dL TOXIC: > 10 mg/dL  -----------------------)  Routine Hem:  02-Feb-16 11:49   Hemoglobin (CBC) 13.2    17:05   Hemoglobin (CBC)  11.8 (Result(s) reported on 11 Oct 2014 at 05:28PM.)    22:38   Hemoglobin (CBC)  11.0 (Result(s) reported on 11 Oct 2014 at 10:59PM.)  03-Feb-16 04:06   WBC (CBC) 8.2  RBC (CBC)  3.04  Hemoglobin (CBC)  10.7  Hematocrit (CBC)  31.8  Platelet Count (CBC)  140  MCV  105   MCH  35.2  MCHC 33.6  RDW 14.2  Neutrophil % 67.5  Lymphocyte % 19.2  Monocyte % 10.4  Eosinophil % 2.6  Basophil % 0.3  Neutrophil # 5.5  Lymphocyte # 1.6  Monocyte # 0.9  Eosinophil # 0.2  Basophil # 0.0 (Result(s) reported on 12 Oct 2014 at 04:51AM.)   Assessment/Plan:  Assessment/Plan:  Assessment 1) gi bleeding/heme positive stool, dark.   Impression of upper GI bleeding.  similar episode noted about 2 weeks ago. Stable on ib ppi drip, hemodynamically stable.   Plan 1) egd.  I have discussed the risks benefits and complications of proceedure to include not limited to bleeding infection perforationa dn sedation and he wishes to proceed.  I was told yesterday by a daughter, confirmed today by conversation with patients son that patietn is able to make his own medical decisionas and consent.   I attempted to contact POA unsuccessfully.   Electronic Signatures: SLoistine Simas(MD)  (Signed 03-Feb-16 14:44)  Authored: Chief Complaint, VITAL SIGNS/ANCILLARY NOTES, Brief Assessment, Lab Results, Assessment/Plan   Last Updated: 03-Feb-16 14:44 by SLoistine Simas(MD)

## 2015-01-08 NOTE — Consult Note (Signed)
PATIENT NAME:  Johnathan Jones, Johnathan Jones MR#:  [id number] DATE OF BIRTH:  [birth year]  DATE OF CONSULTATION:  10/21/2014  REFERRING PHYSICIAN:   CONSULTING PHYSICIAN:  Johnathan Jones, ANP (Adult Nurse Practitioner)  REFERRING PHYSICIAN: Dr. Sheryle Hail.   CONSULTING PHYSICIAN: Johnathan Prude, MD/Tristan Proto Arvilla Jones, ANP.   REASON FOR CONSULTATION: GI bleed recurrent.   HISTORY OF PRESENT ILLNESS: This 81 year old male presented to the Emergency Room today reporting 2 episodes of nonpainful bright red blood per rectum. He reports weakness, denies nausea or vomiting. He has had decreased appetite. He has been following with a BRAT diet due to similar symptoms last week. He was discharged on 10/14/2014 for what appeared to be an upper GI bleed. Due to his severe kyphosis an upper endoscopy was deferred. The caretakers took a photograph of the blood specimen in his Depends which was bright red blood. Admission BUN is 23 with hemoglobin 11.6, now down to 10.3. The patient says he has had some difficulty swallowing with a sore throat, decreased appetite, and he only can eat soft foods. He has had no further rectal bleeding since admission.   Dr. Marva Panda saw him 10/11/2014 for a full consult for rectal bleeding. 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 He has had no further bowel movement since admission. The patient reports he has had increasing frailty. He has had diagnosis of Parkinsonism and takes medication irregularly.   PAST MEDICAL HISTORY:  1. Prostate cancer in 2005 treated with external beam radiation.  2. History of hyperlipidemia.  3. Hypertension.  4. Osteoporosis.  5. Chronic thrombocytopenia.  6. Glaucoma.    7. Parkinsonism, takes medication irregularly.  8. History of colonic AVMs, multiple nonbleeding colonic angiectasias, internal hemorrhoids and 2 small polyps per colonoscopy in 2007.   SOCIAL HISTORY: Negative tobacco or alcohol use.    FAMILY HISTORY: Negative for colon cancer.   REVIEW OF SYSTEMS: 10 systems reviewed per admission history and physical with pertinent positives noted in the history of present illness.   ALLERGIES: TIMOLOL AND TYLENOL.    PHYSICAL EXAMINATION:  VITAL SIGNS: 99.1, 75, 18, 150/71. Pulse oximetry on room air is 97%.  GENERAL: Frail, elderly Caucasian male sitting up in bed. Severe kyphosis noted.  HEENT:  The patient is normocephalic.  NECK: With trachea midline.  HEART: Heart tones S1, S2 with a systolic murmur.  LUNGS: CTA anteriorly.  ABDOMEN: Soft, nontender.  EXTREMITIES: Lower extremities without edema.  SKIN: Warm and dry.  NEUROLOGIC:  The patient is alert, oriented, requesting urinal.    LABORATORY DATA: Admission laboratory study notable for BUN 23, creatinine 0.75. Electrolytes unremarkable. Albumin 3.2. Liver panel normal. Troponin less than 0.02. Hemoglobin 11.6 to 12.0, now 10.3. Pro time 13.5, INR 1.0.   RADIOLOGY:  Portable chest single view shows no acute cardiopulmonary findings.   IMPRESSION:  Patient with history of upper and now bright red rectal bleeding. Bright red rectal bleeding sounds fresh, spots of blood, this could be secondary to arteriovenous malformations bleeding. Does not appear to be a diverticular bleed. Would continue to follow the patient clinically with serial CBCs. No recommendation for luminal evaluation as the patient states that he does not have the strength to go through with a colonoscopy. There been discussion regarding upper endoscopy with severe kyphosis and the decision was made not to pursue that evaluation as well. Dr. Mechele Collin did speak with his daughter Dois Davenport.   Thank you for this consultation.  These services provided by Johnathan BradfordKimberly A. Arvilla MarketMills, MS, APRN, BC, ANP under collaborative agreement with Johnathan Prudeobert Elliott, MD.     ____________________________ Ranae PlumberKimberly A. Arvilla MarketMills, ANP (Adult Nurse Practitioner) kam:bu D: 10/21/2014 16:55:20  ET T: 10/21/2014 18:40:25 ET JOB#: [id number]  cc: Johnathan BradfordKimberly A. Arvilla MarketMills, ANP (Adult Nurse Practitioner), <Dictator> Ranae PlumberKimberly A. Suzette BattiestMills RN, MSN, ANP-BC Adult Nurse Practitioner ELECTRONICALLY SIGNED 10/27/2014 16:04

## 2015-01-08 NOTE — Consult Note (Signed)
Pt seen for Dr. Marva PandaSkulskie.   Pt denies any bleeding, no vomiting, anterior chest clear, abd not tender, no HSM, hgb stable in low 11 range. VSS, afebrile.  It seems the plans are for discharge tomorrow and I agree with this.  If something changes between now and discharge and GI imput is needed then Dr. Shelle Ironein will be covering tomorrow.  Electronic Signatures: Scot JunElliott, Kanna Dafoe T (MD)  (Signed on 04-Feb-16 18:16)  Authored  Last Updated: 04-Feb-16 18:16 by Scot JunElliott, Carmesha Morocco T (MD)

## 2015-01-08 NOTE — Consult Note (Signed)
No bleeding, no abd pain. VSS afeb.  I will sign off, reconsult if needed.  Likely recent BRBPR was due to proctitis from radiation to prostate or internal hemorrhoids.  Electronic Signatures: Scot JunElliott, Takashi Korol T (MD)  (Signed on 14-Feb-16 15:19)  Authored  Last Updated: 14-Feb-16 15:19 by Scot JunElliott, Ulonda Klosowski T (MD)

## 2015-01-08 NOTE — H&P (Signed)
PATIENT NAME:  Johnathan Jones, Johnathan Jones MR#:  [id number] DATE OF BIRTH:  1928/10/20  DATE OF ADMISSION:  10/11/2014  PRIMARY CARE PHYSICIAN: Donel B. Danne HarborWalker III, MD  REFERRING PHYSICIAN: Bobetta LimeEryka A. Inocencio HomesGayle, MD  CHIEF COMPLAINT: Rectal bleeding since yesterday.   HISTORY OF PRESENT ILLNESS: An 81 year old Caucasian male with a history of Parkinson disease and hypertension, who was sent from home due to rectal bleeding. The patient is alert, awake, oriented. According to the patient, the patient had spotty, rectal bleeding yesterday, which was fresh red blood. Rectal bleeding has been worsening since today. The patient does still have rectal bleeding in the ED, but the patient denies any headache or dizziness. Denies any generalized weakness. He denies any hematuria or easy bleeding.   PAST MEDICAL HISTORY: Prostate cancer, status post external  beam radiation in 2005, hyperlipidemia, hypertension, osteoporosis, chronic thrombocytopenia, glaucoma.   SOCIAL HISTORY: No smoking or drinking or illicit drugs.   FAMILY HISTORY: A brother has prostate cancer, hypertension, diabetes.   ALLERGIES: TIMOLOL, TYLENOL.   HOME MEDICATIONS: Micardis 80 mg p.o. daily, Latanoprost 0.005% 1 drop to each effected eye once a day at bedtime, Ilevro 0.3% ophthalmic suspension 1 drop to each effected eye once a day, and Colace 100 mg p.o. b.i.d. p.r.n., brimonidine 0.15% 1 drop to each effected eye.   REVIEW OF SYSTEMS:  CONSTITUTIONAL: The patient denies any fever or chills. No headache or dizziness or weakness.  EYES: No double vision or blurred vision. EARS, NOSE, AND THROAT: No post nasal drip. No slurred speech. No dysphagia.  CARDIOVASCULAR: No chest pain, palpitation, orthopnea, or nocturnal dyspnea. No leg edema.  PULMONARY: No cough, sputum, shortness of breath, or hemoptysis.  GASTROINTESTINAL: No abdominal pain, nausea, vomiting, or diarrhea. Has no melena, but has bloody stool.  GENITOURINARY: No dysuria,  hematuria, or incontinence.  SKIN: No rash or jaundice.  NEUROLOGY: No syncope, loss of consciousness or seizure.  ENDOCRINOLOGY: No polyuria, polydipsia, heat or cold intolerance.  HEMATOLOGIC: No easy bleeding or bleeding, but has rectal bleeding since yesterday.   PHYSICAL EXAMINATION:  VITAL SIGNS: Temperature 98.7, blood pressure 139/77, pulse 76, respirations 20, oxygen saturation 100% on room air.  GENERAL: The patient is alert, awake, oriented, in no acute distress.  HEENT: Pupils round, equal and reactive to light and accommodation. Moist oral mucosa. Clear oropharynx.  NECK: Supple. No JVD or carotid bruits. No lymphadenopathy. No thyromegaly.  CARDIOVASCULAR: S1 and S2. Regular rate and rhythm. No murmurs or gallops.  PULMONARY: Bilateral air entry. No wheezing or rales. No use of accessory muscle to breathe.  ABDOMEN: Soft. No distention. No tenderness. No organomegaly. Bowel sounds present.  EXTREMITIES: No edema, clubbing or cyanosis. No calf tenderness. Bilateral pedal pulses present.  SKIN: No rash or jaundice.  NEUROLOGIC: A and O x 3. No focal deficit. Power 4/5. Sensation intact.   LABORATORY DATA: Glucose 75, BUN 30, creatinine 0.69, electrolytes normal. Troponin less than 0.02. WBC 8.6, hemoglobin 13.2, platelets 178,000. Urinalysis showed WBC 6, RBC 4, nitrite negative. EKG shows normal sinus rhythm at 82 BPM.   IMPRESSIONS:  1.  Lower gastrointestinal bleeding.  2.  Hypertension.  3.  Hyperlipidemia.  4.  Parkinson disease.   PLAN OF TREATMENT:  1.  The patient will be admitted to the medical floor. We will continue Protonix drip and get a GI consult. Follow up hemoglobin q. 6 hours and type and screen.  2.  Keep n.p.o. with IV fluid support.  3.  I discussed the  patient's condition and plan of treatment with the patient. The patient wants Limited Code. He wants CPR, but does not want intubation. This was confirmed with the RN.   TIME SPENT: About 55 minutes.     ____________________________ Shaune Pollack, MD qc:MT D: 10/11/2014 14:45:08 ET T: 10/11/2014 15:05:47 ET JOB#: [id number]  cc: Shaune Pollack, MD, <Dictator> Shaune Pollack MD ELECTRONICALLY SIGNED 10/13/2014 16:57

## 2015-01-08 NOTE — Discharge Summary (Signed)
PATIENT NAME:  Johnathan Jones, Johnathan W MR#:  [id number] DATE OF BIRTH:  [birth year]  DATE OF ADMISSION:  10/11/2014 DATE OF DISCHARGE:  10/14/2014  DISCHARGE DIAGNOSES:  1.  Upper gastrointestinal bleed with mild anemia.  2.  Hyperlipidemia.  3.  Hypertension.  4.  Osteoporosis. 5.  History of prostate cancer.  6.  Chronic thrombocytopenia.   DISCHARGE MEDICATIONS: Latanoprost ophthalmic solution 1 drop both eyes at bedtime, Ilevro 0.3% solution 1 drop both eyes daily, brimonidine 0.15% drop both eyes b.i.d., Micardis 80 mg daily, Protonix 40 mg every a.m.   REASON FOR ADMISSION: An 81 year old male who presents with a GI bleed. Please see H and P for HPI, past medical history and physical examination.  HOSPITAL COURSE: The patient was admitted. Hemoglobin dropped down to 10.7 and it came back to 11.8. He could not be scoped because of neck flexion. His hemoglobin is stable without further bleeding. He will continue on Protonix for at least a month and follow up with Dr. Dan HumphreysWalker in 1 week.   ____________________________ Danella PentonMark F. Miller, MD mfm:sb D: 10/14/2014 08:10:36 ET T: 10/14/2014 10:42:19 ET JOB#: [id number]  cc: Danella PentonMark F. Miller, MD, <Dictator> MARK Sherlene ShamsF MILLER MD ELECTRONICALLY SIGNED 10/17/2014 8:14

## 2015-01-08 NOTE — Consult Note (Signed)
Pt denies any further rectal bleeding.  No abd pain, no vomiting. Hgb 10.8, plt 174, WBC 7.2, VSS afebrile.  Could likely go home Monday if no further bleeding tomorrow.  Electronic Signatures: Scot JunElliott, Robert T (MD)  (Signed on 13-Feb-16 13:07)  Authored  Last Updated: 13-Feb-16 13:07 by Scot JunElliott, Robert T (MD)

## 2015-01-08 NOTE — Consult Note (Signed)
Brief Consult Note: Diagnosis: GI bleeding.   Patient was seen by consultant.   Consult note dictated.   Recommend to proceed with surgery or procedure.   Recommend further assessment or treatment.   Orders entered.   Comments: Please see full GI consult.  patietn admitted with bloody stool starting this am.  However had episode of black/dark stool about 2 weeks ago as well.  Rectal exam shows thin watery dark brown effluent that is brightly hemoccult positive.   No marroon stool or bright red.  I am concerned for upper gi bleeding.  Patient is hemodynamically stable and no repeat bleeding since about 11am.  Continue iv ppi as you are, serial hgb, transfuse as needed, will arrange for EGD tomorrow pm.  I have discussed the risks benefits and complications of egd to include not limited to bleeding infection perforation and sedation and he wishes to proceed.  Further recs to follow.  Electronic Signatures for Addendum Section:  Barnetta ChapelSkulskie, Asenath Balash (MD) (Signed Addendum [phone]-Feb-16 20:57)  please see full consult note [phone].   Electronic Signatures: Barnetta ChapelSkulskie, Buren Havey (MD)  (Signed [id number]-Feb-16 20:57)  Authored: Brief Consult Note   Last Updated: 02-Feb-16 20:57 by Barnetta ChapelSkulskie, Marlaine Arey (MD)

## 2015-01-08 NOTE — Consult Note (Signed)
Pt with hx of AVM of rectum from radiation treatment of prostate that was found on colonoscopy from 10/2005 and cauterized with argon via colonoscopy.  This is very likely the source of his BRBPR of yesterday.  No further bleeding or bowel movement today.  Will start him on full liquids today, follow along.  May need repeat treatment if he rebleeds.  If so would need a prep but would not sedate him due to neck issues. I will follow with you.  Electronic Signatures: Scot JunElliott, Robert T (MD)  (Signed on 12-Feb-16 16:47)  Authored  Last Updated: 12-Feb-16 16:47 by Scot JunElliott, Robert T (MD)

## 2015-01-08 NOTE — Discharge Summary (Signed)
PATIENT NAME:  Johnathan Jones, [name] MR#:  [id number] DATE OF BIRTH:  Sep 25, 1928  DATE OF ADMISSION:  10/21/2014 DATE OF DISCHARGE:  10/25/2014   DISCHARGE DIAGNOSES:  1.  Lower gastrointestinal bleed.  2.  Parkinson disease.  3.  Hypertension.   HISTORY OF PRESENT ILLNESS: Please see initial history and physical from 10/21/2014. Briefly, an 81 year old gentleman with history of severe Parkinson's and prior prostate cancer status post radiation, who had a recent admission for what appeared to be an upper gastrointestinal bleed.  This time he was admitted with bright red blood per rectum, large amount in his diaper.    HOSPITAL COURSE:   1.  Bright red blood per rectum.  The patient was seen by GI.  He had serial hemoglobins done. Given his body habitus and his frailty, decision was made not to undergo any endoscopies.  It was felt he likely had proctitis from prior radiation. His hemoglobin remained stable. He was continued on a b.i.d. PPD.  2.  Hypertension. The patient remained stable on his outpatient antihypertensives.  3.  Parkinson disease. The patient declines treatment with Sinemet.   DISCHARGE MEDICATIONS:  [facility]  Briefly, this includes Latanoprost eyes solution, Colace, Ilevro 0.3% ophthalmological suspension, Brimonidine 0.15% ophthalmological solution, Micardis 80 mg once a day and pantoprazole 40 mg twice a day.   DISCHARGE DIET:  Low sodium, regular consistency with Ensure once a day.   DISCHARGE INSTRUCTIONS:  The patient will follow up with Dr. Dan HumphreysWalker within 2 weeks and follow up with gastroenterology as scheduled.   CODE STATUS: The patient was seen by palliative care during the hospitalization. Discussion was made and he was made DO NOT RESUSCITATE.   TIME SPENT ON DISCHARGE:  This discharge took 40 minutes.      ____________________________ Stann Mainlandavid P. Sampson GoonFitzgerald, MD dpf:DT D: 10/25/2014 08:36:00 ET T: 10/25/2014 08:57:53  ET JOB#: [id number]  cc: Stann Mainlandavid P. Sampson GoonFitzgerald, MD, <Dictator> Bryne Lindon Sampson GoonFITZGERALD MD ELECTRONICALLY SIGNED 11/01/2014 21:26

## 2015-01-08 NOTE — Consult Note (Signed)
No further bleeding, i will sign off, reconsult if needed.  Electronic Signatures: Scot JunElliott, Draya Felker T (MD)  (Signed on 15-Feb-16 17:41)  Authored  Last Updated: 15-Feb-16 17:41 by Scot JunElliott, Parissa Chiao T (MD)

## 2015-01-08 NOTE — Consult Note (Signed)
PATIENT NAME:  Johnathan Jones, Johnathan W MR#:  [id number] DATE OF BIRTH:  28-Oct-1928  DATE OF CONSULTATION:  10/11/2014  CONSULTING PHYSICIAN:  Christena DeemMartin U. Codi Folkerts, MD  Patient of Dr. Imogene Burnhen.  REASON FOR CONSULTATION: Rectal bleeding.   HISTORY OF PRESENT ILLNESS: Mr. Su HiltRoberts is an 81 year old Caucasian male who was in his usual state of health at home until about 2 nights ago. There was apparently some small amount of bloody material noted in his diaper; however, this was only a small amount. Then this morning he had a bowel movement that was loose and said to be bloody. This was apparently reported as being bright red blood. The last episode of rectal bleeding that he had was about 6-8 hours ago. He has had no repeat bleeding since he came to the hospital. He also denies having any rectal bleeding in the past. He may have had a bout of diverticulitis a number of years ago. There is also a very remote history of peptic ulcer disease for which he apparently did not undergo any type of endoscopic treatment.  He states that he has been having increasing problems with constipation, and occasionally has to take a laxative or stool softener. He has been straining a fair amount to have a bowel movement over the past couple of weeks. He denies any nausea or vomiting. He has had some periumbilical discomfort that decreases after a bowel movement. He has some severe spinal arthritic changes and his neck is in a mostly bowed position. On further discussion with him, he had a dark, near black stool about 2 weeks ago. He does have some dysphagia, although there is also a concurrent history of parkinsonism. He currently denies any pain. He has had again no emesis.   PAST MEDICAL HISTORY:  1.  History of prostate cancer in 2005 treated with external beam radiation.  2.  He has a history of hyperlipidemia.  3.  Hypertension.  4.  Osteoporosis.  5.  Chronic thrombocytopenia. 6.  Glaucoma.  7.  He denies any history of heart  problems or lung problems.  8.  He has a history of being diagnosed with parkinsonism, but only takes medication very irregularly.   SOCIAL HISTORY: He does not drink or smoke or use illicit drugs.   GASTROINTESTINAL FAMILY HISTORY:  Negative for colorectal cancer, liver disease, or ulcers.   REVIEW OF SYSTEMS: Ten systems reviewed per admission history and physical, agree with same. It is of note that he apparently did not have a rectal examination in the ER or on admission.   OUTPATIENT MEDICATIONS: Include brimonidine 0.15% ophthalmic solution twice a day, docusate which he takes only intermittently,  Ilevro 0.3% ophthalmic solution, latanoprost 0.005% ophthalmic solution, Micardis 80 mg tablet once a day.  ALLERGIES: TIMOLOL AND TYLENOL.   PHYSICAL EXAMINATION: VITAL SIGNS: Temperature is 97.7, pulse 72, respirations 18, blood pressure 163/76, pulse oximetry 98%.  GENERAL: He is an elderly-appearing 81 year old Caucasian male, no acute distress.  HEENT: Normocephalic, atraumatic. It is of note that he has a very pronounced drop to his cervical spine at the lower cervical region resulting in a marked kyphotic appearance. This he has had for some time. He is able to straighten the spine to some amount, but not to a vertical upright position.  Nose, septum midline. Oropharynx, no lesions.  HEART: Regular rate and rhythm.  LUNGS: Clear.  ABDOMEN: Soft, nontender, nondistended. Bowel sounds positive, normoactive.  RECTAL: Anorectal examination shows a soft brown stool that has a mild  coating of blackish material. There is no bright red rectal bleeding. There is a very watery effluent as well. There is some blackish-brownish debris around the rectal orifice.  EXTREMITIES: No clubbing, cyanosis, or edema.  NEUROLOGICAL: Cranial nerves II-XII grossly intact.   LABORATORY DATA: Include the following: On admission to the hospital, he had a glucose of 75, BUN elevated at 30, creatinine 0.69, sodium  142, potassium 4.4, chloride 104, bicarbonate 32, calcium 9.2, lipase 124. Hepatic profile was normal. His hemogram showed a white count of 8.6, H and H 13.2/40.3, platelet count 178,000, MCV 104. A repeat hemoglobin about 8 hours after the first showed 11.8. Again, he has had no repeat rectal bleeding or repeat bowel movement since his admission. His pro time is 12.9, INR of 1.0. UA showing a 30 milligram per deciliter of protein.   IMAGING:  He had a portable chest film showing a mild stable elevation of right hemidiaphragm with overlying scar or atelectasis. Normal heart size and mediastinal contours. No active disease noted.   ASSESSMENT:  Gastrointestinal bleed. Although on presentation there was reported bright red rectal bleeding, this was not the result of examination. He does have a history of having some black stool about 2 weeks ago and examination this evening shows a very watery, brightly heme-positive, brownish effluent. The stool itself, however, is brown underneath of this. Concern for possible upper gastrointestinal bleed. He is currently on a Protonix drip. Again, he has been hemodynamically stable without evidence of repeat fresh bleeding.   RECOMMENDATION: 1.  Continue IV PPI as you are.  2.  Serial hemoglobins. Transfuse as needed.  3.  Ice chips only.  4.  Recommend EGD. He seems to be able to straighten the neck vertically adequately to have less concern about scope passage. He does relate having some dysphagia; however, also he has a history of Parkinson's, which is likely undertreated/not treated. I have discussed the risks, benefits, and complications of EGD to include, but not limited to, bleeding, infection, perforation, and the risk of sedation, and he wishes to proceed. One of his daughters was also present and informed of this and is in agreement. We will arrange this for tomorrow afternoon unless there is a change clinically.   ____________________________ Christena Deem, MD mus:LT D: 10/11/2014 20:51:29 ET T: 10/11/2014 21:41:17 ET JOB#: [id number]  cc: Christena Deem, MD, <Dictator> Christena Deem MD ELECTRONICALLY SIGNED 11/01/2014 14:05

## 2015-01-08 NOTE — Consult Note (Signed)
Chief Complaint:  Subjective/Chief Complaint Patietn was brought to endoscopy for possible EGD.  patietn was turned left lateral to position for the proceedure, and I did not feel there was enough extension possible on the neck to safely do the proceedure.   Acute intervention of rrespiratory difficulty woudl have been very difficult.    Recommend continuing emperic treatment with iv ppi for anothe 36 hours before changing to iv bid.  Will check h. pylori.  Discussed with Dr Hyacinth MeekerMiller.   Electronic Signatures: Barnetta ChapelSkulskie, Markeria Goetsch (MD)  (Signed 03-Feb-16 15:08)  Authored: Chief Complaint   Last Updated: 03-Feb-16 15:08 by Barnetta ChapelSkulskie, Lanyla Costello (MD)

## 2015-05-24 IMAGING — CR DG CHEST 1V PORT
1 series · 1 of 1 positions shown · non-contrast
Comparison: Chest radiograph October 11, 2014

CLINICAL DATA: Recurrent rectal bleeding. History of prostate
cancer and Parkinson's.

EXAM:
PORTABLE CHEST - 1 VIEW

[ap]
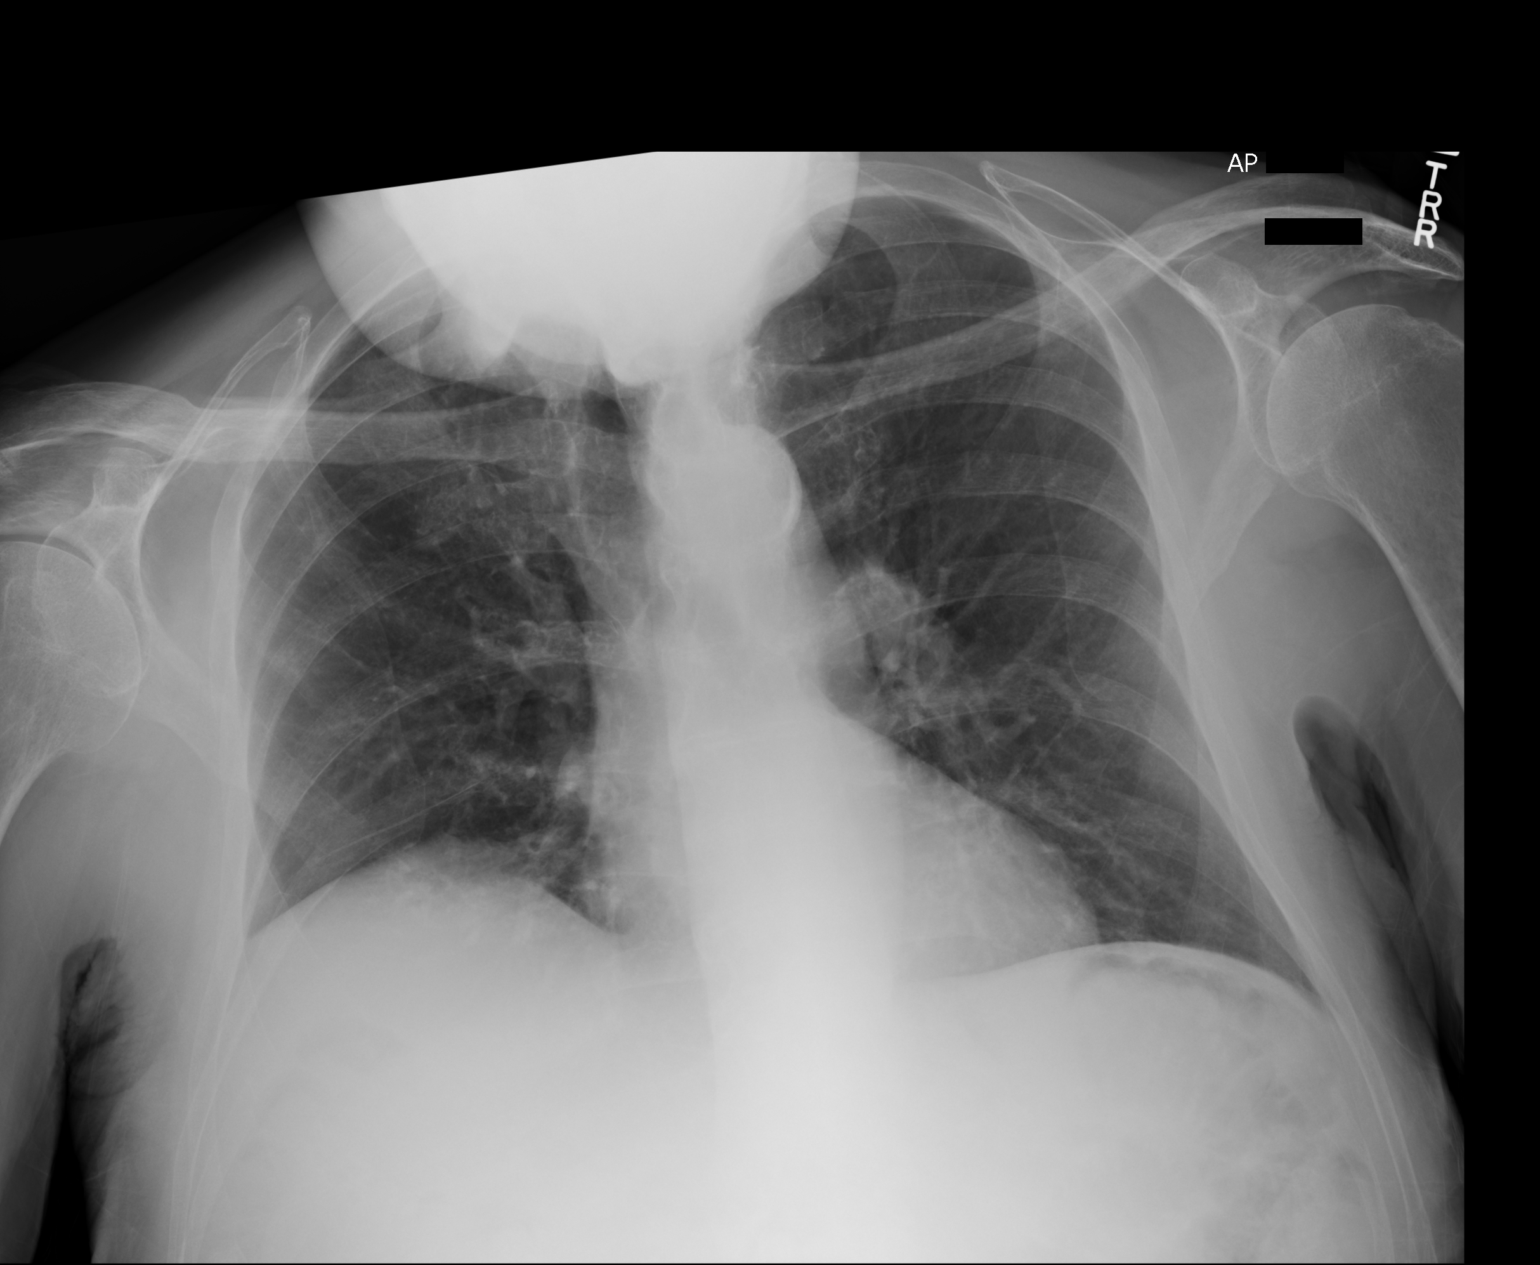

[1 of 1 positions shown; findings below may reference images not displayed]

FINDINGS: Cardiac silhouette is unremarkable for this low inspiratory portable
examination with crowded vasculature markings. Calcified aortic
knob. The lungs are clear without pleural effusions or focal
consolidations. Persistently elevated RIGHT hemidiaphragm. Trachea
projects midline and there is no pneumothorax. Included soft tissue
planes and osseous structures are non-suspicious. Remote RIGHT
clavicle fracture.
IMPRESSION: No acute cardiopulmonary process for this low inspiratory portable
examination.

  By: Susantha Ohab

## 2015-10-02 ENCOUNTER — Telehealth: Payer: Self-pay

## 2015-10-02 NOTE — Telephone Encounter (Signed)
Please call her and let her know there is no specific form for do not hospitalize order---and his order is in (so we are set on that)

## 2015-10-02 NOTE — Telephone Encounter (Signed)
Janey Greaser pts daughter and POA said she was unable to be at meeting at [facility] and Dois Davenport was advised by nurse to call and speak with Dr Alphonsus Sias about a no hospital form at [facility]. Dois Davenport request cb.

## 2015-10-03 NOTE — Telephone Encounter (Signed)
Spoke with daughter and advised results.  

## 2015-10-11 DEATH — deceased
